# Patient Record
Sex: Female | Born: 1970
Health system: Southern US, Community
[De-identification: ages and names within clinical notes are randomized; demographics above are authoritative.]

## PROBLEM LIST (undated history)

## (undated) DIAGNOSIS — F419 Anxiety disorder, unspecified: Secondary | ICD-10-CM

## (undated) DIAGNOSIS — N951 Menopausal and female climacteric states: Secondary | ICD-10-CM

## (undated) DIAGNOSIS — R4589 Other symptoms and signs involving emotional state: Secondary | ICD-10-CM

## (undated) DIAGNOSIS — I10 Essential (primary) hypertension: Secondary | ICD-10-CM

## (undated) DIAGNOSIS — E119 Type 2 diabetes mellitus without complications: Secondary | ICD-10-CM

## (undated) DIAGNOSIS — N926 Irregular menstruation, unspecified: Secondary | ICD-10-CM

## (undated) HISTORY — DX: Anxiety disorder, unspecified: F41.9

## (undated) HISTORY — DX: Essential (primary) hypertension: I10

## (undated) HISTORY — DX: Menopausal and female climacteric states: N95.1

## (undated) HISTORY — DX: Irregular menstruation, unspecified: N92.6

## (undated) HISTORY — PX: CHOLECYSTECTOMY: SHX55

## (undated) HISTORY — PX: BUNIONECTOMY: SHX129

## (undated) HISTORY — DX: Other symptoms and signs involving emotional state: R45.89

## (undated) HISTORY — DX: Type 2 diabetes mellitus without complications: E11.9

---

## 2000-10-19 ENCOUNTER — Other Ambulatory Visit: Admission: RE | Admit: 2000-10-19 | Discharge: 2000-10-19 | Payer: Self-pay | Admitting: Obstetrics and Gynecology

## 2005-05-13 ENCOUNTER — Emergency Department (HOSPITAL_COMMUNITY): Admission: EM | Admit: 2005-05-13 | Discharge: 2005-05-14 | Payer: Self-pay | Admitting: Emergency Medicine

## 2005-09-04 ENCOUNTER — Emergency Department (HOSPITAL_COMMUNITY): Admission: EM | Admit: 2005-09-04 | Discharge: 2005-09-04 | Payer: Self-pay | Admitting: Emergency Medicine

## 2005-10-15 ENCOUNTER — Ambulatory Visit (HOSPITAL_COMMUNITY): Admission: RE | Admit: 2005-10-15 | Discharge: 2005-10-15 | Payer: Self-pay | Admitting: Gastroenterology

## 2005-10-22 ENCOUNTER — Ambulatory Visit (HOSPITAL_COMMUNITY): Admission: RE | Admit: 2005-10-22 | Discharge: 2005-10-22 | Payer: Self-pay | Admitting: Otolaryngology

## 2005-11-28 ENCOUNTER — Emergency Department (HOSPITAL_COMMUNITY): Admission: EM | Admit: 2005-11-28 | Discharge: 2005-11-28 | Payer: Self-pay | Admitting: Emergency Medicine

## 2006-01-10 ENCOUNTER — Ambulatory Visit: Payer: Self-pay | Admitting: Cardiology

## 2006-01-18 ENCOUNTER — Encounter: Payer: Self-pay | Admitting: Cardiology

## 2006-01-18 ENCOUNTER — Ambulatory Visit: Payer: Self-pay

## 2006-01-19 ENCOUNTER — Ambulatory Visit: Payer: Self-pay | Admitting: Cardiology

## 2006-02-23 ENCOUNTER — Ambulatory Visit: Payer: Self-pay

## 2006-03-29 ENCOUNTER — Ambulatory Visit (HOSPITAL_COMMUNITY): Admission: RE | Admit: 2006-03-29 | Discharge: 2006-03-29 | Payer: Self-pay | Admitting: Family Medicine

## 2006-04-22 ENCOUNTER — Ambulatory Visit (HOSPITAL_COMMUNITY): Admission: RE | Admit: 2006-04-22 | Discharge: 2006-04-22 | Payer: Self-pay | Admitting: Family Medicine

## 2006-04-29 ENCOUNTER — Encounter (HOSPITAL_COMMUNITY): Admission: RE | Admit: 2006-04-29 | Discharge: 2006-05-29 | Payer: Self-pay | Admitting: Family Medicine

## 2006-05-09 ENCOUNTER — Ambulatory Visit (HOSPITAL_COMMUNITY): Admission: RE | Admit: 2006-05-09 | Discharge: 2006-05-09 | Payer: Self-pay | Admitting: General Surgery

## 2006-05-09 ENCOUNTER — Encounter (INDEPENDENT_AMBULATORY_CARE_PROVIDER_SITE_OTHER): Payer: Self-pay | Admitting: *Deleted

## 2006-06-16 ENCOUNTER — Ambulatory Visit (HOSPITAL_COMMUNITY): Admission: RE | Admit: 2006-06-16 | Discharge: 2006-06-16 | Payer: Self-pay | Admitting: General Surgery

## 2007-03-21 ENCOUNTER — Other Ambulatory Visit: Admission: RE | Admit: 2007-03-21 | Discharge: 2007-03-21 | Payer: Self-pay | Admitting: Obstetrics and Gynecology

## 2007-08-10 ENCOUNTER — Ambulatory Visit (HOSPITAL_COMMUNITY): Admission: RE | Admit: 2007-08-10 | Discharge: 2007-08-10 | Payer: Self-pay | Admitting: Family Medicine

## 2007-12-15 ENCOUNTER — Other Ambulatory Visit: Admission: RE | Admit: 2007-12-15 | Discharge: 2007-12-15 | Payer: Self-pay | Admitting: Obstetrics and Gynecology

## 2007-12-19 ENCOUNTER — Ambulatory Visit (HOSPITAL_COMMUNITY): Admission: RE | Admit: 2007-12-19 | Discharge: 2007-12-19 | Payer: Self-pay | Admitting: Obstetrics & Gynecology

## 2008-01-05 ENCOUNTER — Encounter (HOSPITAL_COMMUNITY): Admission: RE | Admit: 2008-01-05 | Discharge: 2008-02-04 | Payer: Self-pay | Admitting: Endocrinology

## 2008-08-01 ENCOUNTER — Ambulatory Visit (HOSPITAL_COMMUNITY): Admission: RE | Admit: 2008-08-01 | Discharge: 2008-08-01 | Payer: Self-pay | Admitting: Obstetrics & Gynecology

## 2009-01-13 ENCOUNTER — Other Ambulatory Visit: Admission: RE | Admit: 2009-01-13 | Discharge: 2009-01-13 | Payer: Self-pay | Admitting: Obstetrics and Gynecology

## 2009-08-04 ENCOUNTER — Ambulatory Visit (HOSPITAL_COMMUNITY): Admission: RE | Admit: 2009-08-04 | Discharge: 2009-08-04 | Payer: Self-pay | Admitting: Obstetrics & Gynecology

## 2009-12-15 ENCOUNTER — Other Ambulatory Visit: Admission: RE | Admit: 2009-12-15 | Discharge: 2009-12-15 | Payer: Self-pay | Admitting: Obstetrics and Gynecology

## 2010-06-30 ENCOUNTER — Other Ambulatory Visit (HOSPITAL_COMMUNITY): Payer: Self-pay | Admitting: Family Medicine

## 2010-06-30 DIAGNOSIS — Z139 Encounter for screening, unspecified: Secondary | ICD-10-CM

## 2010-07-31 NOTE — Assessment & Plan Note (Signed)
[facility]                              CARDIOLOGY OFFICE NOTE   NAME:Lori Kline, Lori Kline                     MRN:          [id number]  DATE:01/10/2006                            DOB:          01-16-1971    The patient is a 40 year old female with no prior cardiac history I am asked  to evaluate for palpitations.  She occasionally has mild dyspnea on exertion  but there is no orthopnea, PND, pedal edema, presycope, syncope or  exertional chest pain.  Since June she has had occasional palpitations.  These predominately occur at night.  They are sudden in onset and described  as her heart skipping and racing.  There is no associated chest pain but  there is mild shortness of breath.  There is no syncope.  They can last for  2 hours at a time and resolve spontaneously.  She states that she falls  asleep and when she awakes they are gone.  She has had 1 to 2 episodes per  month and her last was in September.  Because of her palpitations we were  asked to further evaluate. Note she was placed on Toprol for this but she  did not tolerate the medication due to fatigue.   Her medications at present include Altace 5 mg p.o. daily.   SOCIAL HISTORY:  She does not smoke, nor does she consume alcohol.   FAMILY HISTORY:  Is negative for sudden death or coronary artery disease in  her immediate family.   PAST MEDICAL HISTORY:  Significant for hypertension but there is no diabetes  mellitus or hyperlipidemia.  She has had a prior EGD for reflux but has  otherwise had no other problems.  She does have occasional mild asthma.   REVIEW OF SYSTEMS:  She denies any headaches or fevers or chills.  There is  no productive cough or hemoptysis.  There is no dysphagia or odynophagia,  melena or hematochezia.  There is no dysuria, hematuria, frequency.  No rash  or seizure activity.  There is no orthopnea, PND or pedal edema.  The  remaining systems are negative.   PHYSICAL EXAMINATION:  Shows blood pressure of 118/76 and her pulse is 91.  She weighs 162 pounds.  She is well-developed and well-nourished.  No acute distress.  SKIN:  Is warm and dry.  She does not appear to be depressed and there is no peripheral clubbing.  HEENT:  Unremarkable with no mileage.  NECK:  Is supple with a normal upstroke bilaterally, cannot appreciate  bruits.  There is no jugular venous distention, no thyromegaly is noted.  CHEST:  Clear to auscultation, normal expansion.  CARDIOVASCULAR:  Shows a regular rate and rhythm with normal S1 and S2.  There are no murmurs, rubs, gallops noted.  ABDOMEN:  Not tender, positive bowel sounds, no hepatosplenomegaly, no  masses appreciated.  There is no abdominal bruit. She had 2+ femoral pulses  bilaterally, no bruits.  EXTREMITIES:  Showed no edema.  I cannot palpate no cords.  She has 2+  dorsalis pedis pulses bilaterally.  NEUROLOGIC:  Grossly  intact.   Her left cardiogram shows a sinus rhythm at a rate of 91.  The axis is  normal.  There are nonspecific anterior T-wave changes.   DIAGNOSES:  1. Palpitations.  2. Hypertension.   PLAN:  1. Lori Kline is complaining of palpitations.  We will proceed with a      CardioNet and we will fully evaluate.  If we indeed demonstrate      arrhythmia then we can pursue based on those results.  Of note she has      not tolerated Toprol due to fatigue but we could certainly try a      different beta blocker in the future to see if she would tolerate.  I      will also schedule her a TSH to exclude hyperthyroidism and also an      echocardiogram to quantify left ventricular function.  2. We will see her back in 6 weeks to review the above information.  Note      she only consumes 1 caffeinated beverage per day.      t    ______________________________  Madolyn Frieze. Jens Som, MD, [facility]    BSC/MedQ  DD: 01/10/2006  DT: 01/11/2006  Job #: [id number]

## 2010-07-31 NOTE — H&P (Signed)
NAME:  Lori Kline, Lori Kline NO.:  [id number]   MEDICAL RECORD NO.:  [id number]          PATIENT TYPE:  AMB   LOCATION:  DAY                           FACILITY:  [facility]   PHYSICIAN:  Dalia Heading, M.D.  DATE OF BIRTH:  [birth year]   DATE OF ADMISSION:  DATE OF DISCHARGE:  LH                              HISTORY & PHYSICAL   CHIEF COMPLAINT:  Chronic cholecystitis.   HISTORY OF PRESENT ILLNESS:  The patient is a 40 year old white female  who is referred for evaluation and treatment of biliary colic secondary  to chronic cholecystitis.  She has been having right upper quadrant  abdominal pain, nausea and bloating for many months.  She does have  fatty food intolerance.  No fever, chills or jaundice have been noted.   PAST MEDICAL HISTORY:  Includes a Chiari brain malformation.   PAST SURGICAL HISTORY:  Unremarkable.   CURRENT MEDICATIONS:  1. Altace 5 mg p.o. daily.  2. Prilosec p.r.n.   ALLERGIES:  No known drug allergies.   REVIEW OF SYSTEMS:  The patient denies drinking or smoking.   PHYSICAL EXAMINATION:  The patient is a well-developed, well-nourished  white female in no acute distress.  HEENT examination reveals no scleral  icterus.  Lungs clear to auscultation with equal breath sounds  bilaterally.  Heart examination reveals a regular rate and rhythm  without S3, S4 or murmurs.  The abdomen is soft and nondistended.  She  is tender in the right upper quadrant to palpation.  No  hepatosplenomegaly, masses, hernias are identified.  HIDA scan reveals  chronic cholecystitis with a low gallbladder ejection fraction.   IMPRESSION:  Chronic cholecystitis.   PLAN:  The patient is scheduled for laparoscopic cholecystectomy on  May 09, 2006.  The risks and benefits of the procedure including  bleeding, infection, hepatobiliary, the possibility of an open procedure  were fully explained to the patient, gave informed consent.      Dalia Heading, M.D.  Electronically Signed     MAJ/MEDQ  D:  05/05/2006  T:  05/05/2006  Job:  [id number]   cc:   [facility] Short-Stay   Patrica Duel, M.D.  Fax: [phone]

## 2010-07-31 NOTE — Op Note (Signed)
NAME:  Lori Kline, SHAWGO NO.:  [id number]   MEDICAL RECORD NO.:  [id number]          PATIENT TYPE:  AMB   LOCATION:  DAY                           FACILITY:  [facility]   PHYSICIAN:  Dalia Heading, M.D.  DATE OF BIRTH:  10-May-1970   DATE OF PROCEDURE:  05/09/2006  DATE OF DISCHARGE:                               OPERATIVE REPORT   PREOPERATIVE DIAGNOSIS:  Chronic cholecystitis.   POSTOPERATIVE DIAGNOSIS:  Chronic cholecystitis.   PROCEDURE:  Laparoscopic cholecystectomy.   SURGEON:  Dr. Franky Macho   ANESTHESIA:  General endotracheal.   INDICATIONS:  The patient is a 40 year old white female who is referred  for evaluation and treatment of biliary colic secondary to chronic  cholecystitis.  Risks and benefits of procedure including bleeding,  infection, hepatobiliary injury, possibly an open procedure were fully  explained to the patient, gave informed consent.   PROCEDURE NOTE:  The patient was placed in supine position.  After  induction of general endotracheal anesthesia, the abdomen was prepped  and draped in the usual sterile technique with Betadine.  Surgical site  confirmation was performed.   An infraumbilical incision was made down to fascia.  Veress needle was  introduced into the abdominal cavity and confirmation of placement was  done using the saline drop test.  The abdomen was then insufflated to 16  mmHg pressure.  11 mm trocar was introduced into the abdominal cavity  under direct visualization without difficulty.  The patient was placed  in reversed Trendelenburg position.  Additional 11-mm trocar placed in  epigastric region and 5-mm trocars were placed in the right upper  quadrant and right flank regions.  Liver was inspected and noted to be  within normal limits.  The gallbladder was retracted superior and  laterally.  The dissection was begun around the infundibulum of the  gallbladder.  The cystic duct was first identified.  Its junction  to the  infundibulum fully identified.  Endoclips placed proximally and distally  on cystic duct and cystic duct was divided.  This was likewise done to  cystic artery.  The gallbladder then freed away from the gallbladder  fossa using Bovie electrocautery.  The gallbladder delivered through the  epigastric trocar site using EndoCatch bag.  The gallbladder fossa was  inspected.  No abnormal bleeding or bile leakage was noted.  Surgicel  was placed in gallbladder fossa.  All fluid and air were then evacuated  from the abdominal cavity prior to removal of the trocars.   All wounds were irrigated with normal saline.  All wounds were injected  with 0.5% Sensorcaine.  The infraumbilical fascia was reapproximated  using an 0 Vicryl interrupted suture.  All skin incisions were closed  using staples.  Betadine ointment and dry sterile dressings were  applied.   All tape and needle counts were correct at end of the procedure.  The  patient was extubated in the operating room, went back to recovery room  awake in stable condition.  Complications none.   SPECIMEN:  Gallbladder.   BLOOD LOSS:  Minimal.      Loraine Leriche  Val Riles, M.D.  Electronically Signed     MAJ/MEDQ  D:  05/09/2006  T:  05/09/2006  Job:  [id number]   cc:   Patrica Duel, M.D.  Fax: [phone]

## 2010-07-31 NOTE — Assessment & Plan Note (Signed)
[facility]                            CARDIOLOGY OFFICE NOTE   NAME:Towe, PEGGIE HORNAK                     MRN:          [id number]  DATE:02/23/2006                            DOB:          1970/10/14    Lori Kline returns in followup today.  She is a pleasant 40 year old  female whom I recently evaluated for palpitations.  We scheduled her to  have an echocardiogram, which was performed on January 18, 2006.  She  was found to have normal LV function.  There was no significant valvular  abnormality noted.  We also checked a TSH, which was normal at 0.42.  Finally, she had a CardioNet monitor that showed sinus rhythm.  Note,  she did not have any palpitations while she had the CardioNet monitor.  Since I saw her previously, she has not had any further palpitations,  and there is no dyspnea on exertion, exertional chest pain, or syncope.  She does occasionally have indigestion associated with water brash.   MEDICATIONS:  Altace 5 mg p.o. daily.   PHYSICAL EXAM:  Blood pressure 120/74, pulse 82.  She weighs 164 pounds.  NECK:  Supple.  CHEST:  Clear.  CARDIOVASCULAR:  Regular rate and rhythm.  EXTREMITIES:  No edema.   DIAGNOSES:  1. Palpitations.  2. History of hypertension.   PLAN:  Lori Kline is doing well since I saw her previously.  She has  had no further symptoms of palpitations.  There was no rhythm  disturbance on a CardioNet monitor, but again, she did not have any  symptoms.  We will continue to follow her closely.  If she has recurrent  palpitations in the future that are sustained, then we could potentially  have her return to the office for an electrocardiogram or to the  emergency room, and I have instructed her in this matter.  She has not  tolerated Toprol in the past, but we could try beta blocker in the  future if her symptoms worsen.  However, she would like to just continue  to follow this for now.  I will see her back  in 6 months, and if she is  doing well at that time, then we will see her back on an as needed  basis.     Madolyn Frieze Jens Som, MD, [facility]  Electronically Signed    BSC/MedQ  DD: 02/23/2006  DT: 02/23/2006  Job #: [id number]   cc:   [facility]

## 2010-08-07 ENCOUNTER — Ambulatory Visit (HOSPITAL_COMMUNITY)
Admission: RE | Admit: 2010-08-07 | Discharge: 2010-08-07 | Disposition: A | Discharge status: Home / Self Care | Payer: 59 | Source: Ambulatory Visit | Attending: Family Medicine | Admitting: Family Medicine

## 2010-08-07 DIAGNOSIS — Z139 Encounter for screening, unspecified: Secondary | ICD-10-CM

## 2010-08-07 DIAGNOSIS — Z1231 Encounter for screening mammogram for malignant neoplasm of breast: Secondary | ICD-10-CM | POA: Insufficient documentation

## 2010-08-14 ENCOUNTER — Other Ambulatory Visit: Payer: Self-pay | Admitting: Family Medicine

## 2010-08-14 DIAGNOSIS — R928 Other abnormal and inconclusive findings on diagnostic imaging of breast: Secondary | ICD-10-CM

## 2010-08-19 ENCOUNTER — Ambulatory Visit (HOSPITAL_COMMUNITY)
Admission: RE | Admit: 2010-08-19 | Discharge: 2010-08-19 | Disposition: A | Discharge status: Home / Self Care | Payer: 59 | Source: Ambulatory Visit | Attending: Family Medicine | Admitting: Family Medicine

## 2010-08-19 DIAGNOSIS — R928 Other abnormal and inconclusive findings on diagnostic imaging of breast: Secondary | ICD-10-CM

## 2010-08-19 DIAGNOSIS — N6489 Other specified disorders of breast: Secondary | ICD-10-CM | POA: Insufficient documentation

## 2011-01-13 ENCOUNTER — Other Ambulatory Visit (HOSPITAL_COMMUNITY)
Admission: RE | Admit: 2011-01-13 | Discharge: 2011-01-13 | Disposition: A | Discharge status: Home / Self Care | Payer: 59 | Source: Ambulatory Visit | Attending: Obstetrics and Gynecology | Admitting: Obstetrics and Gynecology

## 2011-01-13 ENCOUNTER — Other Ambulatory Visit: Payer: Self-pay | Admitting: Adult Health

## 2011-01-13 DIAGNOSIS — Z01419 Encounter for gynecological examination (general) (routine) without abnormal findings: Secondary | ICD-10-CM | POA: Insufficient documentation

## 2011-07-20 ENCOUNTER — Other Ambulatory Visit (HOSPITAL_COMMUNITY): Payer: Self-pay | Admitting: Family Medicine

## 2011-07-20 DIAGNOSIS — Z139 Encounter for screening, unspecified: Secondary | ICD-10-CM

## 2011-08-23 ENCOUNTER — Ambulatory Visit (HOSPITAL_COMMUNITY): Payer: 59

## 2011-08-26 ENCOUNTER — Ambulatory Visit (HOSPITAL_COMMUNITY)
Admission: RE | Admit: 2011-08-26 | Discharge: 2011-08-26 | Disposition: A | Discharge status: Home / Self Care | Payer: 59 | Source: Ambulatory Visit | Attending: Family Medicine | Admitting: Family Medicine

## 2011-08-26 DIAGNOSIS — Z139 Encounter for screening, unspecified: Secondary | ICD-10-CM

## 2011-08-26 DIAGNOSIS — Z1231 Encounter for screening mammogram for malignant neoplasm of breast: Secondary | ICD-10-CM | POA: Insufficient documentation

## 2012-01-18 ENCOUNTER — Other Ambulatory Visit: Payer: Self-pay | Admitting: Adult Health

## 2012-01-18 ENCOUNTER — Other Ambulatory Visit (HOSPITAL_COMMUNITY)
Admission: RE | Admit: 2012-01-18 | Discharge: 2012-01-18 | Disposition: A | Discharge status: Home / Self Care | Payer: 59 | Source: Ambulatory Visit | Attending: Obstetrics and Gynecology | Admitting: Obstetrics and Gynecology

## 2012-01-18 DIAGNOSIS — Z01419 Encounter for gynecological examination (general) (routine) without abnormal findings: Secondary | ICD-10-CM | POA: Insufficient documentation

## 2012-01-18 DIAGNOSIS — Z1151 Encounter for screening for human papillomavirus (HPV): Secondary | ICD-10-CM | POA: Insufficient documentation

## 2012-07-18 ENCOUNTER — Other Ambulatory Visit: Payer: Self-pay | Admitting: Adult Health

## 2012-07-18 DIAGNOSIS — Z139 Encounter for screening, unspecified: Secondary | ICD-10-CM

## 2012-08-28 ENCOUNTER — Ambulatory Visit (HOSPITAL_COMMUNITY)
Admission: RE | Admit: 2012-08-28 | Discharge: 2012-08-28 | Disposition: A | Discharge status: Home / Self Care | Payer: 59 | Source: Ambulatory Visit | Attending: Adult Health | Admitting: Adult Health

## 2012-08-28 DIAGNOSIS — Z139 Encounter for screening, unspecified: Secondary | ICD-10-CM

## 2012-08-28 DIAGNOSIS — Z1231 Encounter for screening mammogram for malignant neoplasm of breast: Secondary | ICD-10-CM | POA: Insufficient documentation

## 2013-02-12 ENCOUNTER — Other Ambulatory Visit: Payer: Self-pay | Admitting: Adult Health

## 2013-03-14 ENCOUNTER — Encounter: Payer: Self-pay | Admitting: Adult Health

## 2013-03-14 ENCOUNTER — Ambulatory Visit (INDEPENDENT_AMBULATORY_CARE_PROVIDER_SITE_OTHER): Payer: 59 | Admitting: Adult Health

## 2013-03-14 ENCOUNTER — Encounter (INDEPENDENT_AMBULATORY_CARE_PROVIDER_SITE_OTHER): Payer: Self-pay

## 2013-03-14 VITALS — BP 118/70 | HR 76 | Ht 61.5 in | Wt 170.0 lb

## 2013-03-14 DIAGNOSIS — Z01419 Encounter for gynecological examination (general) (routine) without abnormal findings: Secondary | ICD-10-CM

## 2013-03-14 DIAGNOSIS — Z1212 Encounter for screening for malignant neoplasm of rectum: Secondary | ICD-10-CM

## 2013-03-14 LAB — HEMOCCULT GUIAC POC 1CARD (OFFICE)

## 2013-03-14 NOTE — Patient Instructions (Signed)
Physical in 1 year Mammogram yearly Labs with PCP 

## 2013-03-14 NOTE — Progress Notes (Signed)
Patient ID: Lori Kline, female   DOB: [birth year], 42 y.o.   MRN: [id number] History of Present Illness: Lori Kline is a 42 year old white female, married in for a physical, she had a normal pap with negative HPV in 2013.No complaints,  Current Medications, Allergies, Past Medical History, Past Surgical History, Family History and Social History were reviewed in [employer] record.   Past Medical History  Diagnosis Date  . Hypertension   . Diabetes mellitus without complication   . Anxiety    Past Surgical History  Procedure Laterality Date  . Bunionectomy    . Cholecystectomy    Current outpatient prescriptions:escitalopram (LEXAPRO) 10 MG tablet, Take 10 mg by mouth daily. , Disp: , Rfl: ;  lisinopril (PRINIVIL,ZESTRIL) 5 MG tablet, Take 5 mg by mouth daily. , Disp: , Rfl: ;  metFORMIN (GLUCOPHAGE) 500 MG tablet, Take 250 mg by mouth 2 (two) times daily with a meal. , Disp: , Rfl: ;  ReliOn Ultra Thin Lancets MISC, , Disp: , Rfl:   Review of Systems: Patient denies any headaches, blurred vision, shortness of breath, chest pain, abdominal pain, problems with bowel movements, urination, or intercourse. No joint pain but is healing from bunionectomy, no mood swings,occasional hot flash.Periods regular.    Physical Exam:BP 118/70  Pulse 76  Ht 5' 1.5" (1.562 m)  Wt 170 lb (77.111 kg)  BMI 31.60 kg/m2  LMP 03/07/2013 General:  Well developed, well nourished, no acute distress Skin:  Warm and dry Neck:  Midline trachea, normal thyroid Lungs; Clear to auscultation bilaterally Breast:  No dominant palpable mass, retraction, or nipple discharge Cardiovascular: Regular rate and rhythm Abdomen:  Soft, non tender, no hepatosplenomegaly Pelvic:  External genitalia is normal in appearance.  The vagina is normal in appearance. The cervix is bulbous.  Uterus is felt to be normal size, shape, and contour.  No  adnexal masses or tenderness noted. Rectal: Good sphincter  tone, no polyps, or hemorrhoids felt.  Hemoccult negative. Extremities:  No swelling or varicosities noted Psych:  No mood changes,alert and cooperative,seems happy   Impression: Yearly gyn exam no pap   Plan: Physical in 1 year Mammogram yearly Labs with PCP(Dr Phillips Odor) Follow up any problems

## 2013-08-01 ENCOUNTER — Other Ambulatory Visit (HOSPITAL_COMMUNITY): Payer: Self-pay | Admitting: Family Medicine

## 2013-08-01 DIAGNOSIS — Z139 Encounter for screening, unspecified: Secondary | ICD-10-CM

## 2013-09-03 ENCOUNTER — Ambulatory Visit (HOSPITAL_COMMUNITY)
Admission: RE | Admit: 2013-09-03 | Discharge: 2013-09-03 | Disposition: A | Discharge status: Home / Self Care | Payer: 59 | Source: Ambulatory Visit | Attending: Family Medicine | Admitting: Family Medicine

## 2013-09-03 DIAGNOSIS — Z1231 Encounter for screening mammogram for malignant neoplasm of breast: Secondary | ICD-10-CM | POA: Insufficient documentation

## 2013-09-03 DIAGNOSIS — Z139 Encounter for screening, unspecified: Secondary | ICD-10-CM

## 2014-01-14 ENCOUNTER — Encounter: Payer: Self-pay | Admitting: Adult Health

## 2014-03-19 ENCOUNTER — Encounter: Payer: Self-pay | Admitting: Adult Health

## 2014-03-19 ENCOUNTER — Ambulatory Visit (INDEPENDENT_AMBULATORY_CARE_PROVIDER_SITE_OTHER): Payer: 59 | Admitting: Adult Health

## 2014-03-19 VITALS — BP 130/80 | HR 76 | Ht 60.5 in | Wt 169.5 lb

## 2014-03-19 DIAGNOSIS — Z01419 Encounter for gynecological examination (general) (routine) without abnormal findings: Secondary | ICD-10-CM

## 2014-03-19 DIAGNOSIS — Z1212 Encounter for screening for malignant neoplasm of rectum: Secondary | ICD-10-CM

## 2014-03-19 LAB — HEMOCCULT GUIAC POC 1CARD (OFFICE): Fecal Occult Blood, POC: NEGATIVE

## 2014-03-19 NOTE — Progress Notes (Signed)
Patient ID: Lori Kline, female   DOB: 1971/01/19, 44 y.o.   MRN: [id number] History of Present Illness: Lori Kline is a 44 year old white female, married in for gyn exam.She had a normal pap with negative HPV 01/18/12.   Current Medications, Allergies, Past Medical History, Past Surgical History, Family History and Social History were reviewed in [employer] record.     Review of Systems: Patient denies any daily headaches(does have before period at times), blurred vision, shortness of breath, chest pain, abdominal pain, problems with bowel movements, urination, or intercourse.  No joint pain or mood swings,last A1c 5.4 with PCP.Periods regular still, no hot flashes.   Physical Exam:BP 130/80 mmHg  Pulse 76  Ht 5' 0.5" (1.537 m)  Wt 169 lb 8 oz (76.885 kg)  BMI 32.55 kg/m2  LMP 02/18/2014 General:  Well developed, well nourished, no acute distress Skin:  Warm and dry Neck:  Midline trachea, normal thyroid Lungs; Clear to auscultation bilaterally Breast:  No dominant palpable mass, retraction, or nipple discharge Cardiovascular: Regular rate and rhythm Abdomen:  Soft, non tender, no hepatosplenomegaly Pelvic:  External genitalia is normal in appearance.  The vagina is normal in appearance. The cervix is bulbous.  Uterus is felt to be normal size, shape, and contour.  No   adnexal masses or tenderness noted. Rectal: Good sphincter tone, no polyps, or hemorrhoids felt.  Hemoccult negative. Extremities:  No swelling or varicosities noted,does have some spider veins Psych:  No mood changes,alert and cooperative,seems happy   Impression: Well woman gyn exam no pap   Plan: Pap and physical in 1 year Mammogram yearly Colonoscopy at 50  Labs with PCP

## 2014-03-19 NOTE — Patient Instructions (Signed)
Pap and physical in 1 year Mammogram yearly Labs with PCP 

## 2014-08-07 ENCOUNTER — Other Ambulatory Visit (HOSPITAL_COMMUNITY): Payer: Self-pay | Admitting: Family Medicine

## 2014-08-07 DIAGNOSIS — Z1231 Encounter for screening mammogram for malignant neoplasm of breast: Secondary | ICD-10-CM

## 2014-09-18 ENCOUNTER — Ambulatory Visit (HOSPITAL_COMMUNITY)
Admission: RE | Admit: 2014-09-18 | Discharge: 2014-09-18 | Disposition: A | Discharge status: Home / Self Care | Payer: 59 | Source: Ambulatory Visit | Attending: Family Medicine | Admitting: Family Medicine

## 2014-09-18 DIAGNOSIS — Z1231 Encounter for screening mammogram for malignant neoplasm of breast: Secondary | ICD-10-CM | POA: Diagnosis present

## 2015-01-21 HISTORY — PX: HAMMER TOE SURGERY: SHX385

## 2015-03-26 ENCOUNTER — Encounter: Payer: Self-pay | Admitting: Adult Health

## 2015-03-26 ENCOUNTER — Other Ambulatory Visit (HOSPITAL_COMMUNITY)
Admission: RE | Admit: 2015-03-26 | Discharge: 2015-03-26 | Disposition: A | Discharge status: Home / Self Care | Payer: 59 | Source: Ambulatory Visit | Attending: Adult Health | Admitting: Adult Health

## 2015-03-26 ENCOUNTER — Ambulatory Visit (INDEPENDENT_AMBULATORY_CARE_PROVIDER_SITE_OTHER): Payer: 59 | Admitting: Adult Health

## 2015-03-26 VITALS — BP 130/80 | HR 76 | Ht 61.0 in | Wt 168.5 lb

## 2015-03-26 DIAGNOSIS — Z01419 Encounter for gynecological examination (general) (routine) without abnormal findings: Secondary | ICD-10-CM

## 2015-03-26 DIAGNOSIS — R4589 Other symptoms and signs involving emotional state: Secondary | ICD-10-CM

## 2015-03-26 DIAGNOSIS — Z1151 Encounter for screening for human papillomavirus (HPV): Secondary | ICD-10-CM | POA: Insufficient documentation

## 2015-03-26 DIAGNOSIS — N951 Menopausal and female climacteric states: Secondary | ICD-10-CM

## 2015-03-26 DIAGNOSIS — N926 Irregular menstruation, unspecified: Secondary | ICD-10-CM | POA: Insufficient documentation

## 2015-03-26 DIAGNOSIS — Z1212 Encounter for screening for malignant neoplasm of rectum: Secondary | ICD-10-CM

## 2015-03-26 HISTORY — DX: Other symptoms and signs involving emotional state: R45.89

## 2015-03-26 HISTORY — DX: Irregular menstruation, unspecified: N92.6

## 2015-03-26 HISTORY — DX: Menopausal and female climacteric states: N95.1

## 2015-03-26 LAB — HEMOCCULT GUIAC POC 1CARD (OFFICE): Fecal Occult Blood, POC: NEGATIVE

## 2015-03-26 NOTE — Progress Notes (Signed)
Patient ID: Lori Kline, female   DOB: [birth year], 45 y.o.   MRN: [id number] History of Present Illness: Lori Kline is a 45 year old white female, married in for a well woman gyn exam and pap, she has had changes in cycle, seem shorter at times and sees some clots, is moody at times.Has had rare hot flash.Got flu shot in October at work. PCP is Dr Hilma Favors.    Current Medications, Allergies, Past Medical History, Past Surgical History, Family History and Social History were reviewed in [employer] record.     Review of Systems: Patient denies any headaches, hearing loss, fatigue, blurred vision, shortness of breath, chest pain, abdominal pain, problems with bowel movements, urination, or intercourse. No joint pain. See HPI for positives.    Physical Exam:BP 130/80 mmHg  Pulse 76  Ht 5\' 1"  (1.549 m)  Wt 168 lb 8 oz (76.431 kg)  BMI 31.85 kg/m2  LMP 03/09/2015 General:  Well developed, well nourished, no acute distress Skin:  Warm and dry Neck:  Midline trachea, normal thyroid, good ROM, no lymphadenopathy Lungs; Clear to auscultation bilaterally Breast:  No dominant palpable mass, retraction, or nipple discharge Cardiovascular: Regular rate and rhythm Abdomen:  Soft, non tender, no hepatosplenomegaly Pelvic:  External genitalia is normal in appearance, no lesions.  The vagina is normal in appearance. Urethra has no lesions or masses. The cervix is bulbous. Pap with HPV performed. Uterus is felt to be normal size, shape, and contour.  No adnexal masses or tenderness noted.Bladder is non tender, no masses felt. Rectal: Good sphincter tone, no polyps, or hemorrhoids felt.  Hemoccult negative. Extremities/musculoskeletal:  No swelling or varicosities noted, no clubbing or cyanosis Psych:  No mood changes, alert and cooperative,seems happy Discussed changes associated with peri menopause.  Impression: Well woman gyn exam and pap Peri menopause Irregular  cycles Moody     Plan: Physical in 1 year, pap in 3 if normal Mammogram yearly Labs with PCP Review handout on peri menopause

## 2015-03-26 NOTE — Patient Instructions (Addendum)
Mammogram yearly Labs with PCP Physical in 1 year, pap in 3 if normal Perimenopause Perimenopause is the time when your body begins to move into the menopause (no menstrual period for 12 straight months). It is a natural process. Perimenopause can begin 2-8 years before the menopause and usually lasts for 1 year after the menopause. During this time, your ovaries may or may not produce an egg. The ovaries vary in their production of estrogen and progesterone hormones each month. This can cause irregular menstrual periods, difficulty getting pregnant, vaginal bleeding between periods, and uncomfortable symptoms. CAUSES  Irregular production of the ovarian hormones, estrogen and progesterone, and not ovulating every month.  Other causes include:  Tumor of the pituitary gland in the brain.  Medical disease that affects the ovaries.  Radiation treatment.  Chemotherapy.  Unknown causes.  Heavy smoking and excessive alcohol intake can bring on perimenopause sooner. SIGNS AND SYMPTOMS   Hot flashes.  Night sweats.  Irregular menstrual periods.  Decreased sex drive.  Vaginal dryness.  Headaches.  Mood swings.  Depression.  Memory problems.  Irritability.  Tiredness.  Weight gain.  Trouble getting pregnant.  The beginning of losing bone cells (osteoporosis).  The beginning of hardening of the arteries (atherosclerosis). DIAGNOSIS  Your health care provider will make a diagnosis by analyzing your age, menstrual history, and symptoms. He or she will do a physical exam and note any changes in your body, especially your female organs. Female hormone tests may or may not be helpful depending on the amount of female hormones you produce and when you produce them. However, other hormone tests may be helpful to rule out other problems. TREATMENT  In some cases, no treatment is needed. The decision on whether treatment is necessary during the perimenopause should be made by you  and your health care provider based on how the symptoms are affecting you and your lifestyle. Various treatments are available, such as:  Treating individual symptoms with a specific medicine for that symptom.  Herbal medicines that can help specific symptoms.  Counseling.  Group therapy. HOME CARE INSTRUCTIONS   Keep track of your menstrual periods (when they occur, how heavy they are, how long between periods, and how long they last) as well as your symptoms and when they started.  Only take over-the-counter or prescription medicines as directed by your health care provider.  Sleep and rest.  Exercise.  Eat a diet that contains calcium (good for your bones) and soy (acts like the estrogen hormone).  Do not smoke.  Avoid alcoholic beverages.  Take vitamin supplements as recommended by your health care provider. Taking vitamin E may help in certain cases.  Take calcium and vitamin D supplements to help prevent bone loss.  Group therapy is sometimes helpful.  Acupuncture may help in some cases. SEEK MEDICAL CARE IF:   You have questions about any symptoms you are having.  You need a referral to a specialist (gynecologist, psychiatrist, or psychologist). SEEK IMMEDIATE MEDICAL CARE IF:   You have vaginal bleeding.  Your period lasts longer than 8 days.  Your periods are recurring sooner than 21 days.  You have bleeding after intercourse.  You have severe depression.  You have pain when you urinate.  You have severe headaches.  You have vision problems.   This information is not intended to replace advice given to you by your health care provider. Make sure you discuss any questions you have with your health care provider.   Document Released:  04/08/2004 Document Revised: 03/22/2014 Document Reviewed: 09/28/2012 Elsevier Interactive Patient Education [employer].

## 2015-03-28 LAB — CYTOLOGY - PAP

## 2015-07-03 ENCOUNTER — Other Ambulatory Visit: Payer: Self-pay

## 2015-08-14 ENCOUNTER — Other Ambulatory Visit (HOSPITAL_COMMUNITY): Payer: Self-pay | Admitting: Family Medicine

## 2015-08-14 DIAGNOSIS — Z1231 Encounter for screening mammogram for malignant neoplasm of breast: Secondary | ICD-10-CM

## 2015-09-22 ENCOUNTER — Ambulatory Visit (HOSPITAL_COMMUNITY)
Admission: RE | Admit: 2015-09-22 | Discharge: 2015-09-22 | Disposition: A | Discharge status: Home / Self Care | Payer: 59 | Source: Ambulatory Visit | Attending: Family Medicine | Admitting: Family Medicine

## 2015-09-22 DIAGNOSIS — Z1231 Encounter for screening mammogram for malignant neoplasm of breast: Secondary | ICD-10-CM | POA: Diagnosis present

## 2015-10-10 ENCOUNTER — Telehealth: Payer: Self-pay | Admitting: *Deleted

## 2015-10-10 NOTE — Telephone Encounter (Signed)
Left message to call me back but sounds like menopause

## 2016-03-29 ENCOUNTER — Encounter: Payer: Self-pay | Admitting: Adult Health

## 2016-03-29 ENCOUNTER — Ambulatory Visit (INDEPENDENT_AMBULATORY_CARE_PROVIDER_SITE_OTHER): Payer: 59 | Admitting: Adult Health

## 2016-03-29 VITALS — BP 124/63 | HR 74 | Ht 60.75 in | Wt 164.5 lb

## 2016-03-29 DIAGNOSIS — N926 Irregular menstruation, unspecified: Secondary | ICD-10-CM

## 2016-03-29 DIAGNOSIS — Z01419 Encounter for gynecological examination (general) (routine) without abnormal findings: Secondary | ICD-10-CM

## 2016-03-29 DIAGNOSIS — Z1212 Encounter for screening for malignant neoplasm of rectum: Secondary | ICD-10-CM | POA: Diagnosis not present

## 2016-03-29 DIAGNOSIS — Z01411 Encounter for gynecological examination (general) (routine) with abnormal findings: Secondary | ICD-10-CM | POA: Diagnosis not present

## 2016-03-29 DIAGNOSIS — F489 Nonpsychotic mental disorder, unspecified: Secondary | ICD-10-CM

## 2016-03-29 DIAGNOSIS — Z1211 Encounter for screening for malignant neoplasm of colon: Secondary | ICD-10-CM

## 2016-03-29 DIAGNOSIS — R4589 Other symptoms and signs involving emotional state: Secondary | ICD-10-CM

## 2016-03-29 DIAGNOSIS — N951 Menopausal and female climacteric states: Secondary | ICD-10-CM

## 2016-03-29 LAB — HEMOCCULT GUIAC POC 1CARD (OFFICE): Fecal Occult Blood, POC: NEGATIVE

## 2016-03-29 NOTE — Progress Notes (Signed)
Patient ID: Lori Kline, female   DOB: 1970/05/12, 46 y.o.   MRN: [id number] History of Present Illness: Lori Kline is a 46 year old white female,married in for well woman gyn exam, she had normal pap and negative HPV 1/11/7. She is having some irregular periods, hot flashes, moody and headaches before periods at times. PCP is Dr Hilma Favors.    Current Medications, Allergies, Past Medical History, Past Surgical History, Family History and Social History were reviewed in [employer] record.     Review of Systems: Patient denies any hearing loss, fatigue, blurred vision, shortness of breath, chest pain, abdominal pain, problems with bowel movements, urination, or intercourse. No joint pain or mood swings.See HPI for positives.    Physical Exam:BP 124/63 (BP Location: Left Arm, Patient Position: Sitting, Cuff Size: Normal)   Pulse 74   Ht 5' 0.75" (1.543 m)   Wt 164 lb 8 oz (74.6 kg)   LMP 03/22/2016 (Exact Date)   BMI 31.34 kg/m  General:  Well developed, well nourished, no acute distress Skin:  Warm and dry Neck:  Midline trachea, normal thyroid, good ROM, no lymphadenopathy Lungs; Clear to auscultation bilaterally Breast:  No dominant palpable mass, retraction, or nipple discharge Cardiovascular: Regular rate and rhythm Abdomen:  Soft, non tender, no hepatosplenomegaly Pelvic:  External genitalia is normal in appearance, no lesions.  The vagina is normal in appearance. Urethra has no lesions or masses. The cervix is smooth.  Uterus is felt to be normal size, shape, and contour.  No adnexal masses or tenderness noted.Bladder is non tender, no masses felt. Rectal: Good sphincter tone, no polyps, or hemorrhoids felt.  Hemoccult negative. Extremities/musculoskeletal:  No swelling or varicosities noted, no clubbing or cyanosis Psych:  No mood changes, alert and cooperative,seems happy PHQ 2 score 0. Discussed menopause symptoms, and could try HRT, declines for now.    Impression:  1. Well woman exam with routine gynecological exam   2. Peri-menopause   3. Irregular periods/menstrual cycles   4. Moody   5. Hot flashes due to menopause   6. Screening for colorectal cancer      Plan: Physical in 1 year, pap in 2020 Mammogram yearly Colonoscopy at 46 Labs with PCP  Review handout on menopause

## 2016-03-29 NOTE — Patient Instructions (Signed)
Menopause Menopause is the normal time of life when menstrual periods stop completely. Menopause is complete when you have missed 12 consecutive menstrual periods. It usually occurs between the ages of 48 years and 55 years. Very rarely does a woman develop menopause before the age of 40 years. At menopause, your ovaries stop producing the female hormones estrogen and progesterone. This can cause undesirable symptoms and also affect your health. Sometimes the symptoms may occur 4-5 years before the menopause begins. There is no relationship between menopause and:  Oral contraceptives.  Number of children you had.  Race.  The age your menstrual periods started (menarche).  Heavy smokers and very thin women may develop menopause earlier in life. What are the causes?  The ovaries stop producing the female hormones estrogen and progesterone. Other causes include:  Surgery to remove both ovaries.  The ovaries stop functioning for no known reason.  Tumors of the pituitary gland in the brain.  Medical disease that affects the ovaries and hormone production.  Radiation treatment to the abdomen or pelvis.  Chemotherapy that affects the ovaries.  What are the signs or symptoms?  Hot flashes.  Night sweats.  Decrease in sex drive.  Vaginal dryness and thinning of the vagina causing painful intercourse.  Dryness of the skin and developing wrinkles.  Headaches.  Tiredness.  Irritability.  Memory problems.  Weight gain.  Bladder infections.  Hair growth of the face and chest.  Infertility. More serious symptoms include:  Loss of bone (osteoporosis) causing breaks (fractures).  Depression.  Hardening and narrowing of the arteries (atherosclerosis) causing heart attacks and strokes.  How is this diagnosed?  When the menstrual periods have stopped for 12 straight months.  Physical exam.  Hormone studies of the blood. How is this treated? There are many treatment  choices and nearly as many questions about them. The decisions to treat or not to treat menopausal changes is an individual choice made with your health care provider. Your health care provider can discuss the treatments with you. Together, you can decide which treatment will work best for you. Your treatment choices may include:  Hormone therapy (estrogen and progesterone).  Non-hormonal medicines.  Treating the individual symptoms with medicine (for example antidepressants for depression).  Herbal medicines that may help specific symptoms.  Counseling by a psychiatrist or psychologist.  Group therapy.  Lifestyle changes including: ? Eating healthy. ? Regular exercise. ? Limiting caffeine and alcohol. ? Stress management and meditation.  No treatment.  Follow these instructions at home:  Take the medicine your health care provider gives you as directed.  Get plenty of sleep and rest.  Exercise regularly.  Eat a diet that contains calcium (good for the bones) and soy products (acts like estrogen hormone).  Avoid alcoholic beverages.  Do not smoke.  If you have hot flashes, dress in layers.  Take supplements, calcium, and vitamin D to strengthen bones.  You can use over-the-counter lubricants or moisturizers for vaginal dryness.  Group therapy is sometimes very helpful.  Acupuncture may be helpful in some cases. Contact a health care provider if:  You are not sure you are in menopause.  You are having menopausal symptoms and need advice and treatment.  You are still having menstrual periods after age 55 years.  You have pain with intercourse.  Menopause is complete (no menstrual period for 12 months) and you develop vaginal bleeding.  You need a referral to a specialist (gynecologist, psychiatrist, or psychologist) for treatment. Get help right   away if:  You have severe depression.  You have excessive vaginal bleeding.  You fell and think you have a  broken bone.  You have pain when you urinate.  You develop leg or chest pain.  You have a fast pounding heart beat (palpitations).  You have severe headaches.  You develop vision problems.  You feel a lump in your breast.  You have abdominal pain or severe indigestion. This information is not intended to replace advice given to you by your health care provider. Make sure you discuss any questions you have with your health care provider. Document Released: 05/22/2003 Document Revised: 08/07/2015 Document Reviewed: 09/28/2012 Elsevier Interactive Patient Education  2017 [employer]  

## 2016-04-21 DIAGNOSIS — E119 Type 2 diabetes mellitus without complications: Secondary | ICD-10-CM | POA: Diagnosis not present

## 2016-04-21 DIAGNOSIS — Z1389 Encounter for screening for other disorder: Secondary | ICD-10-CM | POA: Diagnosis not present

## 2016-07-06 DIAGNOSIS — M654 Radial styloid tenosynovitis [de Quervain]: Secondary | ICD-10-CM | POA: Diagnosis not present

## 2016-07-23 DIAGNOSIS — M654 Radial styloid tenosynovitis [de Quervain]: Secondary | ICD-10-CM | POA: Diagnosis not present

## 2016-08-18 ENCOUNTER — Other Ambulatory Visit: Payer: Self-pay | Admitting: Adult Health

## 2016-08-18 DIAGNOSIS — Z1231 Encounter for screening mammogram for malignant neoplasm of breast: Secondary | ICD-10-CM

## 2016-09-01 DIAGNOSIS — E11319 Type 2 diabetes mellitus with unspecified diabetic retinopathy without macular edema: Secondary | ICD-10-CM | POA: Diagnosis not present

## 2016-09-23 ENCOUNTER — Ambulatory Visit (HOSPITAL_COMMUNITY)
Admission: RE | Admit: 2016-09-23 | Discharge: 2016-09-23 | Disposition: A | Discharge status: Home / Self Care | Payer: 59 | Source: Ambulatory Visit | Attending: Adult Health | Admitting: Adult Health

## 2016-09-23 DIAGNOSIS — Z1231 Encounter for screening mammogram for malignant neoplasm of breast: Secondary | ICD-10-CM | POA: Diagnosis present

## 2016-09-27 DIAGNOSIS — D229 Melanocytic nevi, unspecified: Secondary | ICD-10-CM | POA: Diagnosis not present

## 2016-09-27 DIAGNOSIS — L821 Other seborrheic keratosis: Secondary | ICD-10-CM | POA: Diagnosis not present

## 2016-09-27 DIAGNOSIS — L814 Other melanin hyperpigmentation: Secondary | ICD-10-CM | POA: Diagnosis not present

## 2016-11-26 DIAGNOSIS — E119 Type 2 diabetes mellitus without complications: Secondary | ICD-10-CM | POA: Diagnosis not present

## 2017-05-02 DIAGNOSIS — E119 Type 2 diabetes mellitus without complications: Secondary | ICD-10-CM | POA: Diagnosis not present

## 2017-05-18 ENCOUNTER — Encounter: Payer: Self-pay | Admitting: Adult Health

## 2017-05-18 ENCOUNTER — Ambulatory Visit (INDEPENDENT_AMBULATORY_CARE_PROVIDER_SITE_OTHER): Payer: 59 | Admitting: Adult Health

## 2017-05-18 VITALS — BP 130/80 | HR 82 | Ht 60.5 in | Wt 167.0 lb

## 2017-05-18 DIAGNOSIS — Z01411 Encounter for gynecological examination (general) (routine) with abnormal findings: Secondary | ICD-10-CM

## 2017-05-18 DIAGNOSIS — Z1211 Encounter for screening for malignant neoplasm of colon: Secondary | ICD-10-CM

## 2017-05-18 DIAGNOSIS — N3946 Mixed incontinence: Secondary | ICD-10-CM | POA: Insufficient documentation

## 2017-05-18 DIAGNOSIS — N951 Menopausal and female climacteric states: Secondary | ICD-10-CM | POA: Diagnosis not present

## 2017-05-18 DIAGNOSIS — Z01419 Encounter for gynecological examination (general) (routine) without abnormal findings: Secondary | ICD-10-CM | POA: Insufficient documentation

## 2017-05-18 DIAGNOSIS — N926 Irregular menstruation, unspecified: Secondary | ICD-10-CM

## 2017-05-18 DIAGNOSIS — Z1212 Encounter for screening for malignant neoplasm of rectum: Secondary | ICD-10-CM | POA: Diagnosis not present

## 2017-05-18 LAB — HEMOCCULT GUIAC POC 1CARD (OFFICE): Fecal Occult Blood, POC: NEGATIVE

## 2017-05-18 NOTE — Progress Notes (Signed)
Patient ID: Lori Kline, female   DOB: [birth year], 47 y.o.   MRN: [id number] History of Present Illness:  Lori Kline is a 47 year old white female, married,G2P2 in for a well woman gyn exam, she had normal pap with negative  HPV 03/26/15.Last A1c 6.1. PCP is Dr Hilma Favors.  Current Medications, Allergies, Past Medical History, Past Surgical History, Family History and Social History were reviewed in [employer] record.     Review of Systems:  Patient denies any headaches, hearing loss, fatigue, blurred vision, shortness of breath, chest pain, abdominal pain, problems with bowel movements, or intercourse. No joint pain or mood swings. +UI, if has to go, or exercises or coughs, and +irregular periods  Physical Exam:BP 130/80 (BP Location: Left Arm, Patient Position: Sitting, Cuff Size: Normal)   Pulse 82   Ht 5' 0.5" (1.537 m)   Wt 167 lb (75.8 kg)   LMP 03/09/2017   BMI 32.08 kg/m  General:  Well developed, well nourished, no acute distress Skin:  Warm and dry Neck:  Midline trachea, normal thyroid, good ROM, no lymphadenopathy Lungs; Clear to auscultation bilaterally Breast:  No dominant palpable mass, retraction, or nipple discharge Cardiovascular: Regular rate and rhythm Abdomen:  Soft, non tender, no hepatosplenomegaly Pelvic:  External genitalia is normal in appearance, no lesions.  The vagina is normal in appearance. Urethra has no lesions or masses. The cervix is bulbous.  Uterus is felt to be normal size, shape, and contour.  No adnexal masses or tenderness noted.Bladder is non tender, no masses felt. Rectal: Good sphincter tone, no polyps, or hemorrhoids felt.  Hemoccult negative. Extremities/musculoskeletal:  No swelling or varicosities noted, no clubbing or cyanosis Psych:  No mood changes, alert and cooperative,seems happy PHQ 2 score 0.  Impression: 1. Well woman exam with routine gynecological exam   2. Screening for colorectal cancer   3.  Irregular periods/menstrual cycles   4. Peri-menopause   5. Mixed stress and urge urinary incontinence       Plan: Pap and physical in 1 year Mammogram yearly Labs with PCP Can refer to urology if desired.

## 2017-08-02 ENCOUNTER — Telehealth: Payer: Self-pay | Admitting: Adult Health

## 2017-08-02 DIAGNOSIS — N3946 Mixed incontinence: Secondary | ICD-10-CM

## 2017-08-02 NOTE — Telephone Encounter (Signed)
Pt has mixed UI,wants referral to urology,[facility]

## 2017-08-24 ENCOUNTER — Other Ambulatory Visit: Payer: Self-pay | Admitting: Adult Health

## 2017-08-24 DIAGNOSIS — Z1231 Encounter for screening mammogram for malignant neoplasm of breast: Secondary | ICD-10-CM

## 2017-09-13 ENCOUNTER — Ambulatory Visit (INDEPENDENT_AMBULATORY_CARE_PROVIDER_SITE_OTHER): Payer: 59 | Admitting: Urology

## 2017-09-13 DIAGNOSIS — N3941 Urge incontinence: Secondary | ICD-10-CM

## 2017-09-13 DIAGNOSIS — N393 Stress incontinence (female) (male): Secondary | ICD-10-CM | POA: Diagnosis not present

## 2017-09-28 ENCOUNTER — Ambulatory Visit (HOSPITAL_COMMUNITY)
Admission: RE | Admit: 2017-09-28 | Discharge: 2017-09-28 | Disposition: A | Discharge status: Home / Self Care | Payer: 59 | Source: Ambulatory Visit | Attending: Adult Health | Admitting: Adult Health

## 2017-09-28 DIAGNOSIS — Z1231 Encounter for screening mammogram for malignant neoplasm of breast: Secondary | ICD-10-CM | POA: Insufficient documentation

## 2017-10-05 DIAGNOSIS — E11319 Type 2 diabetes mellitus with unspecified diabetic retinopathy without macular edema: Secondary | ICD-10-CM | POA: Diagnosis not present

## 2017-12-14 DIAGNOSIS — R591 Generalized enlarged lymph nodes: Secondary | ICD-10-CM | POA: Diagnosis not present

## 2017-12-20 DIAGNOSIS — Z6831 Body mass index (BMI) 31.0-31.9, adult: Secondary | ICD-10-CM | POA: Diagnosis not present

## 2017-12-20 DIAGNOSIS — E6609 Other obesity due to excess calories: Secondary | ICD-10-CM | POA: Diagnosis not present

## 2017-12-20 DIAGNOSIS — E119 Type 2 diabetes mellitus without complications: Secondary | ICD-10-CM | POA: Diagnosis not present

## 2018-01-17 ENCOUNTER — Ambulatory Visit: Payer: 59 | Admitting: Urology

## 2018-04-12 DIAGNOSIS — R061 Stridor: Secondary | ICD-10-CM | POA: Diagnosis not present

## 2018-04-12 DIAGNOSIS — Z79899 Other long term (current) drug therapy: Secondary | ICD-10-CM | POA: Diagnosis not present

## 2018-04-12 DIAGNOSIS — R0602 Shortness of breath: Secondary | ICD-10-CM | POA: Diagnosis not present

## 2018-04-12 DIAGNOSIS — J050 Acute obstructive laryngitis [croup]: Secondary | ICD-10-CM | POA: Diagnosis not present

## 2018-04-12 DIAGNOSIS — R509 Fever, unspecified: Secondary | ICD-10-CM | POA: Diagnosis not present

## 2018-04-12 DIAGNOSIS — B9789 Other viral agents as the cause of diseases classified elsewhere: Secondary | ICD-10-CM | POA: Diagnosis not present

## 2018-04-12 DIAGNOSIS — R05 Cough: Secondary | ICD-10-CM | POA: Diagnosis not present

## 2018-04-13 DIAGNOSIS — R509 Fever, unspecified: Secondary | ICD-10-CM | POA: Diagnosis not present

## 2018-04-13 DIAGNOSIS — J050 Acute obstructive laryngitis [croup]: Secondary | ICD-10-CM | POA: Diagnosis not present

## 2018-04-13 DIAGNOSIS — H6692 Otitis media, unspecified, left ear: Secondary | ICD-10-CM | POA: Diagnosis not present

## 2018-04-13 DIAGNOSIS — J111 Influenza due to unidentified influenza virus with other respiratory manifestations: Secondary | ICD-10-CM | POA: Diagnosis not present

## 2018-05-02 DIAGNOSIS — Z1389 Encounter for screening for other disorder: Secondary | ICD-10-CM | POA: Diagnosis not present

## 2018-05-02 DIAGNOSIS — E6609 Other obesity due to excess calories: Secondary | ICD-10-CM | POA: Diagnosis not present

## 2018-05-02 DIAGNOSIS — Z6831 Body mass index (BMI) 31.0-31.9, adult: Secondary | ICD-10-CM | POA: Diagnosis not present

## 2018-05-02 DIAGNOSIS — E119 Type 2 diabetes mellitus without complications: Secondary | ICD-10-CM | POA: Diagnosis not present

## 2018-05-21 DIAGNOSIS — J069 Acute upper respiratory infection, unspecified: Secondary | ICD-10-CM | POA: Diagnosis not present

## 2018-05-22 ENCOUNTER — Ambulatory Visit (INDEPENDENT_AMBULATORY_CARE_PROVIDER_SITE_OTHER): Payer: BLUE CROSS/BLUE SHIELD | Admitting: Adult Health

## 2018-05-22 ENCOUNTER — Encounter: Payer: Self-pay | Admitting: Adult Health

## 2018-05-22 ENCOUNTER — Other Ambulatory Visit (HOSPITAL_COMMUNITY)
Admission: RE | Admit: 2018-05-22 | Discharge: 2018-05-22 | Disposition: A | Discharge status: Home / Self Care | Payer: BLUE CROSS/BLUE SHIELD | Source: Ambulatory Visit | Attending: Adult Health | Admitting: Adult Health

## 2018-05-22 VITALS — BP 128/65 | HR 74 | Ht 60.5 in | Wt 166.0 lb

## 2018-05-22 DIAGNOSIS — Z01419 Encounter for gynecological examination (general) (routine) without abnormal findings: Secondary | ICD-10-CM | POA: Insufficient documentation

## 2018-05-22 DIAGNOSIS — Z1211 Encounter for screening for malignant neoplasm of colon: Secondary | ICD-10-CM | POA: Insufficient documentation

## 2018-05-22 DIAGNOSIS — Z1212 Encounter for screening for malignant neoplasm of rectum: Secondary | ICD-10-CM | POA: Diagnosis not present

## 2018-05-22 DIAGNOSIS — N951 Menopausal and female climacteric states: Secondary | ICD-10-CM

## 2018-05-22 LAB — HEMOCCULT GUIAC POC 1CARD (OFFICE)

## 2018-05-22 NOTE — Progress Notes (Signed)
Patient ID: Lori Kline, female   DOB: [birth year], 48 y.o.   MRN: [id number] History of Present Illness: Lori Kline is a 48 year old white female, married, G2P2 in for a well woman gyn exam and pap. PCP is Dr Hilma Favors.    Current Medications, Allergies, Past Medical History, Past Surgical History, Family History and Social History were reviewed in [employer] record.     Review of Systems:  Patient denies any headaches, hearing loss, fatigue, blurred vision, shortness of breath, chest pain, abdominal pain, problems with bowel movements, urination, or intercourse. No joint pain +mood swings, periods irregular, last one in September 2019, +hot flashes    Physical Exam:BP 128/65 (BP Location: Left Arm, Patient Position: Sitting, Cuff Size: Normal)   Pulse 74   Ht 5' 0.5" (1.537 m)   Wt 166 lb (75.3 kg)   BMI 31.89 kg/m  General:  Well developed, well nourished, no acute distress Skin:  Warm and dry Neck:  Midline trachea, normal thyroid, good ROM, no lymphadenopathy Lungs; Clear to auscultation bilaterally Breast:  No dominant palpable mass, retraction, or nipple discharge Cardiovascular: Regular rate and rhythm Abdomen:  Soft, non tender, no hepatosplenomegaly Pelvic:  External genitalia is normal in appearance, no lesions.  The vagina is normal in appearance. Urethra has no lesions or masses. The cervix is bulbous and smooth, pap with HPV.  Uterus is felt to be normal size, shape, and contour.  No adnexal masses or tenderness noted.Bladder is non tender, no masses felt. Rectal: Good sphincter tone, no polyps, or hemorrhoids felt.  Hemoccult negative. Extremities/musculoskeletal:  No swelling or varicosities noted, no clubbing or cyanosis Psych:  No mood changes, alert and cooperative,seems happy Fall risk is low. PHQ 2 score 0. Examination chaperoned by Levy Pupa LPN.  Impression: 1. Encounter for gynecological examination with Papanicolaou smear of cervix    2. Screening for colorectal cancer   3. Peri-menopause       Plan: Physical in 1 year Pap in 3 if normal Mammogram yearly Colonoscopy at 39 Labs with PCP, just had some in February

## 2018-05-25 LAB — CYTOLOGY - PAP
Adequacy: ABSENT
Diagnosis: NEGATIVE
HPV: NOT DETECTED

## 2018-09-05 ENCOUNTER — Other Ambulatory Visit (HOSPITAL_COMMUNITY): Payer: Self-pay | Admitting: Adult Health

## 2018-09-05 DIAGNOSIS — Z1231 Encounter for screening mammogram for malignant neoplasm of breast: Secondary | ICD-10-CM

## 2018-10-02 ENCOUNTER — Ambulatory Visit (HOSPITAL_COMMUNITY)
Admission: RE | Admit: 2018-10-02 | Discharge: 2018-10-02 | Disposition: A | Discharge status: Home / Self Care | Payer: BC Managed Care – PPO | Source: Ambulatory Visit | Attending: Adult Health | Admitting: Adult Health

## 2018-10-02 ENCOUNTER — Other Ambulatory Visit: Payer: Self-pay

## 2018-10-02 ENCOUNTER — Encounter (HOSPITAL_COMMUNITY): Payer: Self-pay

## 2018-10-02 DIAGNOSIS — Z1231 Encounter for screening mammogram for malignant neoplasm of breast: Secondary | ICD-10-CM | POA: Diagnosis not present

## 2018-12-14 DIAGNOSIS — Z6831 Body mass index (BMI) 31.0-31.9, adult: Secondary | ICD-10-CM | POA: Diagnosis not present

## 2018-12-14 DIAGNOSIS — Z23 Encounter for immunization: Secondary | ICD-10-CM | POA: Diagnosis not present

## 2018-12-14 DIAGNOSIS — E119 Type 2 diabetes mellitus without complications: Secondary | ICD-10-CM | POA: Diagnosis not present

## 2018-12-14 DIAGNOSIS — E6609 Other obesity due to excess calories: Secondary | ICD-10-CM | POA: Diagnosis not present

## 2019-02-01 DIAGNOSIS — Z20828 Contact with and (suspected) exposure to other viral communicable diseases: Secondary | ICD-10-CM | POA: Diagnosis not present

## 2019-05-10 DIAGNOSIS — J302 Other seasonal allergic rhinitis: Secondary | ICD-10-CM | POA: Diagnosis not present

## 2019-05-10 DIAGNOSIS — J019 Acute sinusitis, unspecified: Secondary | ICD-10-CM | POA: Diagnosis not present

## 2019-05-10 DIAGNOSIS — E119 Type 2 diabetes mellitus without complications: Secondary | ICD-10-CM | POA: Diagnosis not present

## 2019-05-10 DIAGNOSIS — Z6831 Body mass index (BMI) 31.0-31.9, adult: Secondary | ICD-10-CM | POA: Diagnosis not present

## 2019-05-10 DIAGNOSIS — E6609 Other obesity due to excess calories: Secondary | ICD-10-CM | POA: Diagnosis not present

## 2019-05-10 DIAGNOSIS — Z1389 Encounter for screening for other disorder: Secondary | ICD-10-CM | POA: Diagnosis not present

## 2019-07-11 ENCOUNTER — Encounter: Payer: Self-pay | Admitting: Adult Health

## 2019-07-11 ENCOUNTER — Ambulatory Visit (INDEPENDENT_AMBULATORY_CARE_PROVIDER_SITE_OTHER): Payer: BC Managed Care – PPO | Admitting: Adult Health

## 2019-07-11 ENCOUNTER — Other Ambulatory Visit: Payer: Self-pay

## 2019-07-11 VITALS — BP 127/88 | HR 76 | Ht 60.5 in | Wt 170.0 lb

## 2019-07-11 DIAGNOSIS — Z1211 Encounter for screening for malignant neoplasm of colon: Secondary | ICD-10-CM

## 2019-07-11 DIAGNOSIS — Z1212 Encounter for screening for malignant neoplasm of rectum: Secondary | ICD-10-CM

## 2019-07-11 DIAGNOSIS — Z01419 Encounter for gynecological examination (general) (routine) without abnormal findings: Secondary | ICD-10-CM | POA: Diagnosis not present

## 2019-07-11 LAB — HEMOCCULT GUIAC POC 1CARD (OFFICE): Fecal Occult Blood, POC: NEGATIVE

## 2019-07-11 NOTE — Progress Notes (Signed)
Patient ID: Lori Kline, female   DOB: 1970/05/27, 49 y.o.   MRN: [id number] History of Present Illness: Lori Kline is a 49 year old white female,maried, G2P2 in for well woman gyn exam, she had a normal pap with negative HPV 05/22/2018. PCP is Dr Hilma Favors.    Current Medications, Allergies, Past Medical History, Past Surgical History, Family History and Social History were reviewed in [employer] record.     Review of Systems:  Patient denies any headaches, hearing loss, fatigue, blurred vision, shortness of breath, chest pain, abdominal pain, problems with bowel movements, urination, or intercourse. No joint pain or mood swings. No vaginal bleeding   Physical Exam:BP 127/88 (BP Location: Right Arm, Patient Position: Sitting, Cuff Size: Normal)   Pulse 76   Ht 5' 0.5" (1.537 m)   Wt 170 lb (77.1 kg)   LMP 03/09/2017   BMI 32.65 kg/m  General:  Well developed, well nourished, no acute distress Skin:  Warm and dry Neck:  Midline trachea, normal thyroid, good ROM, no lymphadenopathy Lungs; Clear to auscultation bilaterally Breast:  No dominant palpable mass, retraction, or nipple discharge Cardiovascular: Regular rate and rhythm Abdomen:  Soft, non tender, no hepatosplenomegaly Pelvic:  External genitalia is normal in appearance, no lesions.  The vagina is normal in appearance. Urethra has no lesions or masses. The cervix is bulbous.  Uterus is felt to be normal size, shape, and contour.  No adnexal masses or tenderness noted.Bladder is non tender, no masses felt. Rectal: Good sphincter tone, no polyps, or hemorrhoids felt.  Hemoccult negative.Mild low rectocele. Extremities/musculoskeletal:  No swelling or varicosities noted, no clubbing or cyanosis Psych:  No mood changes, alert and cooperative,seems happy AA 0 Fall risk is low PHQ 9 score is 0 Examination chaperoned by Celene Squibb LPN  Impression and Plan: 1. Encounter for well woman exam with routine  gynecological exam Physical in 1 year Pap in 2023 Mammogram yearly Labs with PCP   2. Screening for colorectal cancer Colonoscopy or cologuard at 56

## 2019-07-26 DIAGNOSIS — Z23 Encounter for immunization: Secondary | ICD-10-CM | POA: Diagnosis not present

## 2019-08-17 DIAGNOSIS — Z23 Encounter for immunization: Secondary | ICD-10-CM | POA: Diagnosis not present

## 2019-09-04 ENCOUNTER — Other Ambulatory Visit (HOSPITAL_COMMUNITY): Payer: Self-pay | Admitting: Family Medicine

## 2019-09-04 DIAGNOSIS — Z1231 Encounter for screening mammogram for malignant neoplasm of breast: Secondary | ICD-10-CM

## 2019-10-08 ENCOUNTER — Ambulatory Visit (HOSPITAL_COMMUNITY)
Admission: RE | Admit: 2019-10-08 | Discharge: 2019-10-08 | Disposition: A | Discharge status: Home / Self Care | Payer: BC Managed Care – PPO | Source: Ambulatory Visit | Attending: Family Medicine | Admitting: Family Medicine

## 2019-10-08 ENCOUNTER — Other Ambulatory Visit: Payer: Self-pay

## 2019-10-08 DIAGNOSIS — Z1231 Encounter for screening mammogram for malignant neoplasm of breast: Secondary | ICD-10-CM | POA: Diagnosis not present

## 2019-12-06 DIAGNOSIS — E118 Type 2 diabetes mellitus with unspecified complications: Secondary | ICD-10-CM | POA: Diagnosis not present

## 2019-12-06 DIAGNOSIS — Z23 Encounter for immunization: Secondary | ICD-10-CM | POA: Diagnosis not present

## 2019-12-06 DIAGNOSIS — E6609 Other obesity due to excess calories: Secondary | ICD-10-CM | POA: Diagnosis not present

## 2019-12-06 DIAGNOSIS — Z6831 Body mass index (BMI) 31.0-31.9, adult: Secondary | ICD-10-CM | POA: Diagnosis not present

## 2019-12-11 ENCOUNTER — Encounter (INDEPENDENT_AMBULATORY_CARE_PROVIDER_SITE_OTHER): Payer: Self-pay

## 2020-01-16 ENCOUNTER — Other Ambulatory Visit (INDEPENDENT_AMBULATORY_CARE_PROVIDER_SITE_OTHER): Payer: Self-pay

## 2020-01-16 DIAGNOSIS — Z1211 Encounter for screening for malignant neoplasm of colon: Secondary | ICD-10-CM

## 2020-01-18 ENCOUNTER — Telehealth (INDEPENDENT_AMBULATORY_CARE_PROVIDER_SITE_OTHER): Payer: Self-pay

## 2020-01-18 ENCOUNTER — Encounter (INDEPENDENT_AMBULATORY_CARE_PROVIDER_SITE_OTHER): Payer: Self-pay

## 2020-01-18 MED ORDER — PLENVU 140 G PO SOLR: 1.0000 | Freq: Once | ORAL | 0 refills | Status: AC

## 2020-01-18 NOTE — Telephone Encounter (Signed)
Patient has Plenvu (copay card)

## 2020-01-18 NOTE — Telephone Encounter (Signed)
Ok to schedule.  Thanks,  Taner Rzepka Castaneda Mayorga, MD Gastroenterology and Hepatology [facility]  

## 2020-01-18 NOTE — Telephone Encounter (Signed)
Referring MD/PCP: Hilma Favors   Procedure: TCS  Reason/Indication:  Screening  Has patient had this procedure before?  no  If so, when, by whom and where?    Is there a family history of colon cancer?  no  Who?  What age when diagnosed?    Is patient diabetic?   yes      Does patient have prosthetic heart valve or mechanical valve?  no  Do you have a pacemaker/defibrillator?  no  Has patient ever had endocarditis/atrial fibrillation? no  Does patient use oxygen? no  Has patient had joint replacement within last 12 months?  no  Is patient constipated or do they take laxatives? no  Does patient have a history of alcohol/drug use?  no  Is patient on blood thinner such as Coumadin, Plavix and/or Aspirin? no  Medications: Metformin 1000mg  bid, Escitalopram 10mg  daily  Allergies: nkda  Medication Adjustment per Dr Jenetta Downer hold metformin evening before & morning of procedure  Procedure date & time: 02/19/20 10:00

## 2020-01-21 ENCOUNTER — Encounter (INDEPENDENT_AMBULATORY_CARE_PROVIDER_SITE_OTHER): Payer: Self-pay | Admitting: *Deleted

## 2020-02-18 ENCOUNTER — Other Ambulatory Visit (HOSPITAL_COMMUNITY)
Admission: RE | Admit: 2020-02-18 | Discharge: 2020-02-18 | Disposition: A | Discharge status: Home / Self Care | Payer: BC Managed Care – PPO | Source: Ambulatory Visit | Attending: Gastroenterology | Admitting: Gastroenterology

## 2020-02-18 ENCOUNTER — Other Ambulatory Visit: Payer: Self-pay

## 2020-02-18 DIAGNOSIS — K5730 Diverticulosis of large intestine without perforation or abscess without bleeding: Secondary | ICD-10-CM | POA: Diagnosis not present

## 2020-02-18 DIAGNOSIS — Z79899 Other long term (current) drug therapy: Secondary | ICD-10-CM | POA: Diagnosis not present

## 2020-02-18 DIAGNOSIS — Z20822 Contact with and (suspected) exposure to covid-19: Secondary | ICD-10-CM | POA: Insufficient documentation

## 2020-02-18 DIAGNOSIS — Z01812 Encounter for preprocedural laboratory examination: Secondary | ICD-10-CM | POA: Insufficient documentation

## 2020-02-18 DIAGNOSIS — Z7984 Long term (current) use of oral hypoglycemic drugs: Secondary | ICD-10-CM | POA: Diagnosis not present

## 2020-02-18 DIAGNOSIS — Z87891 Personal history of nicotine dependence: Secondary | ICD-10-CM | POA: Diagnosis not present

## 2020-02-18 DIAGNOSIS — Z1211 Encounter for screening for malignant neoplasm of colon: Secondary | ICD-10-CM | POA: Diagnosis not present

## 2020-02-18 LAB — SARS CORONAVIRUS 2 (TAT 6-24 HRS): SARS Coronavirus 2: NEGATIVE

## 2020-02-19 ENCOUNTER — Ambulatory Visit (HOSPITAL_COMMUNITY): Payer: BC Managed Care – PPO | Admitting: Anesthesiology

## 2020-02-19 ENCOUNTER — Encounter (HOSPITAL_COMMUNITY)
Admission: RE | Disposition: A | Discharge status: Home / Self Care | Payer: Self-pay | Source: Home / Self Care | Attending: Gastroenterology

## 2020-02-19 ENCOUNTER — Ambulatory Visit (HOSPITAL_COMMUNITY)
Admission: RE | Admit: 2020-02-19 | Discharge: 2020-02-19 | Disposition: A | Discharge status: Home / Self Care | Payer: BC Managed Care – PPO | Attending: Gastroenterology | Admitting: Gastroenterology

## 2020-02-19 ENCOUNTER — Encounter (INDEPENDENT_AMBULATORY_CARE_PROVIDER_SITE_OTHER): Payer: Self-pay | Admitting: *Deleted

## 2020-02-19 ENCOUNTER — Encounter (HOSPITAL_COMMUNITY): Payer: Self-pay | Admitting: Gastroenterology

## 2020-02-19 ENCOUNTER — Other Ambulatory Visit: Payer: Self-pay

## 2020-02-19 DIAGNOSIS — Z87891 Personal history of nicotine dependence: Secondary | ICD-10-CM | POA: Diagnosis not present

## 2020-02-19 DIAGNOSIS — Z20822 Contact with and (suspected) exposure to covid-19: Secondary | ICD-10-CM | POA: Insufficient documentation

## 2020-02-19 DIAGNOSIS — Z79899 Other long term (current) drug therapy: Secondary | ICD-10-CM | POA: Diagnosis not present

## 2020-02-19 DIAGNOSIS — Z7984 Long term (current) use of oral hypoglycemic drugs: Secondary | ICD-10-CM | POA: Diagnosis not present

## 2020-02-19 DIAGNOSIS — K5730 Diverticulosis of large intestine without perforation or abscess without bleeding: Secondary | ICD-10-CM | POA: Diagnosis not present

## 2020-02-19 DIAGNOSIS — Z1211 Encounter for screening for malignant neoplasm of colon: Secondary | ICD-10-CM | POA: Diagnosis not present

## 2020-02-19 HISTORY — PX: COLONOSCOPY WITH PROPOFOL: SHX5780

## 2020-02-19 LAB — GLUCOSE, CAPILLARY: Glucose-Capillary: 120 mg/dL — ABNORMAL HIGH (ref 70–99)

## 2020-02-19 LAB — HM COLONOSCOPY

## 2020-02-19 SURGERY — COLONOSCOPY WITH PROPOFOL
Anesthesia: General

## 2020-02-19 MED ORDER — LIDOCAINE HCL (CARDIAC) PF 100 MG/5ML IV SOSY
PREFILLED_SYRINGE | INTRAVENOUS | Status: DC | PRN
  Administered 2020-02-19: 80 mg via INTRAVENOUS

## 2020-02-19 MED ORDER — CHLORHEXIDINE GLUCONATE CLOTH 2 % EX PADS: 6.0000 | MEDICATED_PAD | Freq: Once | CUTANEOUS | Status: DC

## 2020-02-19 MED ORDER — LACTATED RINGERS IV SOLN: INTRAVENOUS | Status: DC | PRN

## 2020-02-19 MED ORDER — LACTATED RINGERS IV SOLN: Freq: Once | INTRAVENOUS | Status: AC

## 2020-02-19 MED ORDER — PROPOFOL 10 MG/ML IV BOLUS
INTRAVENOUS | Status: DC | PRN
  Administered 2020-02-19: 100 mg via INTRAVENOUS
  Administered 2020-02-19: 150 ug/kg/min via INTRAVENOUS

## 2020-02-19 NOTE — Anesthesia Preprocedure Evaluation (Addendum)
Anesthesia Evaluation  Patient identified by MRN, date of birth, ID band Patient awake and Patient confused    Reviewed: Allergy & Precautions, NPO status , Patient's Chart, lab work & pertinent test results  History of Anesthesia Complications Negative for: history of anesthetic complications  Airway Mallampati: II  TM Distance: >3 FB Neck ROM: Full    Dental  (+) Dental Advisory Given Crowns :   Pulmonary former smoker,    Pulmonary exam normal breath sounds clear to auscultation       Cardiovascular Exercise Tolerance: Good hypertension, Normal cardiovascular exam Rhythm:Regular Rate:Normal     Neuro/Psych Anxiety    GI/Hepatic negative GI ROS, Neg liver ROS,   Endo/Other  diabetes, Well Controlled, Type 2, Oral Hypoglycemic Agents  Renal/GU      Musculoskeletal negative musculoskeletal ROS (+)   Abdominal   Peds  Hematology negative hematology ROS (+)   Anesthesia Other Findings   Reproductive/Obstetrics                           Anesthesia Physical Anesthesia Plan  ASA: II  Anesthesia Plan: General   Post-op Pain Management:    Induction: Intravenous  PONV Risk Score and Plan: TIVA  Airway Management Planned: Nasal Cannula, Natural Airway and Simple Face Mask  Additional Equipment:   Intra-op Plan:   Post-operative Plan:   Informed Consent: I have reviewed the patients History and Physical, chart, labs and discussed the procedure including the risks, benefits and alternatives for the proposed anesthesia with the patient or authorized representative who has indicated his/her understanding and acceptance.     Dental advisory given  Plan Discussed with: CRNA and Surgeon  Anesthesia Plan Comments:         Anesthesia Quick Evaluation

## 2020-02-19 NOTE — Anesthesia Postprocedure Evaluation (Signed)
Anesthesia Post Note  Patient: Interior and spatial designer  Procedure(s) Performed: COLONOSCOPY WITH PROPOFOL (N/A )  Patient location during evaluation: Endoscopy Anesthesia Type: General Level of consciousness: awake, oriented, awake and alert and patient cooperative Pain management: pain level controlled Vital Signs Assessment: post-procedure vital signs reviewed and stable Respiratory status: spontaneous breathing, respiratory function stable and nonlabored ventilation Cardiovascular status: blood pressure returned to baseline and stable Postop Assessment: no headache and no backache Anesthetic complications: no   No complications documented.   Last Vitals:  Vitals:   02/19/20 0838 02/19/20 0927  BP: (!) 123/54   Pulse: 87 68  Resp: 17 13  Temp: 36.9 C 36.5 C  SpO2: 97% 98%    Last Pain:  Vitals:   02/19/20 0927  TempSrc: Oral  PainSc: 0-No pain                 Tacy Learn

## 2020-02-19 NOTE — H&P (Signed)
Lori Kline is an 49 y.o. female.   Chief Complaint: screening colonoscopy HPI: 49 year old female with past medical history of anxiety, diabetes, hypertension, coming for screening colonoscopy. The patient has never had a colonoscopy in the past.  The patient denies having any complaints such as melena, hematochezia, abdominal pain or distention, change in her bowel movement consistency or frequency, no changes in her weight recently.  No family history of colorectal cancer.  Past Medical History:  Diagnosis Date  . Anxiety   . Diabetes mellitus without complication ([place])   . Hypertension   . Irregular periods/menstrual cycles 03/26/2015  . [place] 03/26/2015  . Peri-menopause 03/26/2015    Past Surgical History:  Procedure Laterality Date  . BUNIONECTOMY    . CHOLECYSTECTOMY    . HAMMER TOE SURGERY Right 01/21/15    Family History  Problem Relation Age of Onset  . Hyperlipidemia Mother   . Hypertension Mother   . Diabetes Father   . Diabetes Paternal Grandfather    Social History:  reports that she has quit smoking. Her smoking use included cigarettes. She has never used smokeless tobacco. She reports that she does not drink alcohol and does not use drugs.  Allergies: No Known Allergies  Medications Prior to Admission  Medication Sig Dispense Refill  . cholecalciferol (VITAMIN D3) 25 MCG (1000 UNIT) tablet Take 1,000 Units by mouth daily.    Marland Kitchen escitalopram (LEXAPRO) 10 MG tablet Take 10 mg by mouth daily.     Marland Kitchen loratadine (CLARITIN) 10 MG tablet Take 10 mg by mouth daily.    . metFORMIN (GLUCOPHAGE) 1000 MG tablet Take 1,000 mg by mouth 2 (two) times daily.      Results for orders placed or performed during the hospital encounter of 02/19/20 (from the past 48 hour(s))  Glucose, capillary     Status: Abnormal   Collection Time: 02/19/20  8:43 AM  Result Value Ref Range   Glucose-Capillary 120 (H) 70 - 99 mg/dL    Comment: Glucose reference range applies only to samples  taken after fasting for at least 8 hours.   No results found.  Review of Systems  Constitutional: Negative.   HENT: Negative.   Eyes: Negative.   Respiratory: Negative.   Cardiovascular: Negative.   Gastrointestinal: Negative.   Endocrine: Negative.   Genitourinary: Negative.   Musculoskeletal: Negative.   Skin: Negative.   Allergic/Immunologic: Negative.   Neurological: Negative.   Hematological: Negative.   Psychiatric/Behavioral: Negative.     Blood pressure (!) 123/54, pulse 87, temperature 98.4 F (36.9 C), temperature source Oral, resp. rate 17, height 5\' 1"  (1.549 m), weight 74.8 kg, last menstrual period 03/09/2017, SpO2 97 %. Physical Exam  GENERAL: The patient is AO x3, in no acute distress. HEENT: Head is normocephalic and atraumatic. EOMI are intact. Mouth is well hydrated and without lesions. NECK: Supple. No masses LUNGS: Clear to auscultation. No presence of rhonchi/wheezing/rales. Adequate chest expansion HEART: RRR, normal s1 and s2. ABDOMEN: Soft, nontender, no guarding, no peritoneal signs, and nondistended. BS +. No masses. EXTREMITIES: Without any cyanosis, clubbing, rash, lesions or edema. NEUROLOGIC: AOx3, no focal motor deficit. SKIN: no jaundice, no rashes  Assessment/Plan 49 year old female with past medical history of anxiety, diabetes, hypertension, coming for screening colonoscopy. The patient is at average risk for colorectal cancer.  We will proceed with colonoscopy today.  Harvel Quale, MD 02/19/2020, 9:00 AM

## 2020-02-19 NOTE — Transfer of Care (Signed)
Immediate Anesthesia Transfer of Care Note  Patient: Lori Kline  Procedure(s) Performed: COLONOSCOPY WITH PROPOFOL (N/A )  Patient Location: Endoscopy Unit  Anesthesia Type:General  Level of Consciousness: awake, alert , oriented and patient cooperative  Airway & Oxygen Therapy: Patient Spontanous Breathing  Post-op Assessment: Report given to RN, Post -op Vital signs reviewed and stable and Patient moving all extremities  Post vital signs: Reviewed and stable  Last Vitals:  Vitals Value Taken Time  BP    Temp    Pulse    Resp    SpO2      Last Pain:  Vitals:   02/19/20 0838  TempSrc: Oral  PainSc: 8          Complications: No complications documented.

## 2020-02-19 NOTE — Discharge Instructions (Signed)
Colonoscopy, Adult, Care After This sheet gives you information about how to care for yourself after your procedure. Your doctor may also give you more specific instructions. If you have problems or questions, call your doctor. What can I expect after the procedure? After the procedure, it is common to have:  A small amount of blood in your poop (stool) for 24 hours.  Some gas.  Mild cramping or bloating in your belly (abdomen). Follow these instructions at home: Eating and drinking   Drink enough fluid to keep your pee (urine) pale yellow.  Follow instructions from your doctor about what you cannot eat or drink.  Return to your normal diet as told by your doctor. Avoid heavy or fried foods that are hard to digest. Activity  Rest as told by your doctor.  Do not sit for a long time without moving. Get up to take short walks every 1-2 hours. This is important. Ask for help if you feel weak or unsteady.  Return to your normal activities as told by your doctor. Ask your doctor what activities are safe for you. To help cramping and bloating:   Try walking around.  Put heat on your belly as told by your doctor. Use the heat source that your doctor recommends, such as a moist heat pack or a heating pad. ? Put a towel between your skin and the heat source. ? Leave the heat on for 20-30 minutes. ? Remove the heat if your skin turns bright red. This is very important if you are unable to feel pain, heat, or cold. You may have a greater risk of getting burned. General instructions  For the first 24 hours after the procedure: ? Do not drive or use machinery. ? Do not sign important documents. ? Do not drink alcohol. ? Do your daily activities more slowly than normal. ? Eat foods that are soft and easy to digest.  Take over-the-counter or prescription medicines only as told by your doctor.  Keep all follow-up visits as told by your doctor. This is important. Contact a doctor  if:  You have blood in your poop 2-3 days after the procedure. Get help right away if:  You have more than a small amount of blood in your poop.  You see large clumps of tissue (blood clots) in your poop.  Your belly is swollen.  You feel like you may vomit (nauseous).  You vomit.  You have a fever.  You have belly pain that gets worse, and medicine does not help your pain. Summary  After the procedure, it is common to have a small amount of blood in your poop. You may also have mild cramping and bloating in your belly.  For the first 24 hours after the procedure, do not drive or use machinery, do not sign important documents, and do not drink alcohol.  Get help right away if you have a lot of blood in your poop, feel like you may vomit, have a fever, or have more belly pain. This information is not intended to replace advice given to you by your health care provider. Make sure you discuss any questions you have with your health care provider. Document Revised: 09/25/2018 Document Reviewed: 09/25/2018 Elsevier Patient Education  [place]. Diverticulosis  Diverticulosis is a condition that develops when small pouches (diverticula) form in the wall of the large intestine (colon). The colon is where water is absorbed and stool (feces) is formed. The pouches form when the inside layer  of the colon pushes through weak spots in the outer layers of the colon. You may have a few pouches or many of them. The pouches usually do not cause problems unless they become inflamed or infected. When this happens, the condition is called diverticulitis. What are the causes? The cause of this condition is not known. What increases the risk? The following factors may make you more likely to develop this condition:  Being older than age 54. Your risk for this condition increases with age. Diverticulosis is rare among people younger than age 38. By age 70, many people have it.  Eating a  low-fiber diet.  Having frequent constipation.  Being overweight.  Not getting enough exercise.  Smoking.  Taking over-the-counter pain medicines, like aspirin and ibuprofen.  Having a family history of diverticulosis. What are the signs or symptoms? In most people, there are no symptoms of this condition. If you do have symptoms, they may include:  Bloating.  Cramps in the abdomen.  Constipation or diarrhea.  Pain in the lower left side of the abdomen. How is this diagnosed? Because diverticulosis usually has no symptoms, it is most often diagnosed during an exam for other colon problems. The condition may be diagnosed by:  Using a flexible scope to examine the colon (colonoscopy).  Taking an X-ray of the colon after dye has been put into the colon (barium enema).  Having a CT scan. How is this treated? You may not need treatment for this condition. Your health care provider may recommend treatment to prevent problems. You may need treatment if you have symptoms or if you previously had diverticulitis. Treatment may include:  Eating a high-fiber diet.  Taking a fiber supplement.  Taking a live bacteria supplement (probiotic).  Taking medicine to relax your colon. Follow these instructions at home: Medicines  Take over-the-counter and prescription medicines only as told by your health care provider.  If told by your health care provider, take a fiber supplement or probiotic. Constipation prevention Your condition may cause constipation. To prevent or treat constipation, you may need to:  Drink enough fluid to keep your urine pale yellow.  Take over-the-counter or prescription medicines.  Eat foods that are high in fiber, such as beans, whole grains, and fresh fruits and vegetables.  Limit foods that are high in fat and processed sugars, such as fried or sweet foods.  General instructions  Try not to strain when you have a bowel movement.  Keep all  follow-up visits as told by your health care provider. This is important. Contact a health care provider if you:  Have pain in your abdomen.  Have bloating.  Have cramps.  Have not had a bowel movement in 3 days. Get help right away if:  Your pain gets worse.  Your bloating becomes very bad.  You have a fever or chills, and your symptoms suddenly get worse.  You vomit.  You have bowel movements that are bloody or black.  You have bleeding from your rectum. Summary  Diverticulosis is a condition that develops when small pouches (diverticula) form in the wall of the large intestine (colon).  You may have a few pouches or many of them.  This condition is most often diagnosed during an exam for other colon problems.  Treatment may include increasing the fiber in your diet, taking supplements, or taking medicines. This information is not intended to replace advice given to you by your health care provider. Make sure you discuss any questions you  have with your health care provider. Document Revised: 09/28/2018 Document Reviewed: 09/28/2018 Elsevier Patient Education  2020 [place] are being discharged to home.  Eat a high fiber diet.  Your physician has recommended a repeat colonoscopy in 10 years for screening purposes.

## 2020-02-19 NOTE — Op Note (Signed)
[facility] Patient Name: Lori Kline Procedure Date: 02/19/2020 8:52 AM MRN: [id number] Date of Birth: 03-Aug-1970 Attending MD: Maylon Peppers ,  CSN: [id number] Age: 49 Admit Type: Outpatient Procedure:                Colonoscopy Indications:              Screening for colorectal malignant neoplasm Providers:                Maylon Peppers, Janeece Riggers, RN, Nelma Rothman,                            Technician Referring MD:              Medicines:                Monitored Anesthesia Care Complications:            No immediate complications. Estimated Blood Loss:     Estimated blood loss: none. Procedure:                Pre-Anesthesia Assessment:                           - Prior to the procedure, a History and Physical                            was performed, and patient medications, allergies                            and sensitivities were reviewed. The patient's                            tolerance of previous anesthesia was reviewed.                           - The risks and benefits of the procedure and the                            sedation options and risks were discussed with the                            patient. All questions were answered and informed                            consent was obtained.                           - ASA Grade Assessment: II - A patient with mild                            systemic disease.                           After obtaining informed consent, the colonoscope                            was passed under direct vision. Throughout the  procedure, the patient's blood pressure, pulse, and                            oxygen saturations were monitored continuously. The                            [id number]-[id number] ([id number]) scope was introduced through                            the anus and advanced to the the cecum, identified                            by appendiceal orifice and ileocecal valve. The                             colonoscopy was performed without difficulty. The                            patient tolerated the procedure well. The quality                            of the bowel preparation was excellent. Scope                            withdrawal time was 16 minutes. Scope In: 9:05:53 AM Scope Out: 9:25:19 AM Scope Withdrawal Time: 0 hours 16 minutes 26 seconds  Total Procedure Duration: 0 hours 19 minutes 26 seconds  Findings:      The perianal and digital rectal examinations were normal.      A few small and large-mouthed diverticula were found in the sigmoid       colon and ascending colon.      The retroflexed view of the distal rectum and anal verge was normal and       showed no anal or rectal abnormalities. Impression:               - Diverticulosis in the sigmoid colon and in the                            ascending colon.                           - The distal rectum and anal verge are normal on                            retroflexion view.                           - No specimens collected. Moderate Sedation:      Per Anesthesia Care Recommendation:           - Discharge patient to home (ambulatory).                           - High fiber diet.                           -  Repeat colonoscopy in 10 years for screening                            purposes. Procedure Code(s):        --- Professional ---                           Q9476, GC, Colorectal cancer screening; colonoscopy                            on individual not meeting criteria for high risk Diagnosis Code(s):        --- Professional ---                           Z12.11, Encounter for screening for malignant                            neoplasm of colon                           K57.30, Diverticulosis of large intestine without                            perforation or abscess without bleeding CPT copyright 2019 American Medical Association. All rights reserved. The codes documented in this report are preliminary  and upon coder review may  be revised to meet current compliance requirements. Maylon Peppers, MD Maylon Peppers,  02/19/2020 9:31:33 AM This report has been signed electronically. Number of Addenda: 0

## 2020-02-25 ENCOUNTER — Encounter (HOSPITAL_COMMUNITY): Payer: Self-pay | Admitting: Gastroenterology

## 2020-04-17 DIAGNOSIS — E7849 Other hyperlipidemia: Secondary | ICD-10-CM | POA: Diagnosis not present

## 2020-04-17 DIAGNOSIS — E663 Overweight: Secondary | ICD-10-CM | POA: Diagnosis not present

## 2020-04-17 DIAGNOSIS — Z6829 Body mass index (BMI) 29.0-29.9, adult: Secondary | ICD-10-CM | POA: Diagnosis not present

## 2020-04-17 DIAGNOSIS — Z1331 Encounter for screening for depression: Secondary | ICD-10-CM | POA: Diagnosis not present

## 2020-04-17 DIAGNOSIS — E119 Type 2 diabetes mellitus without complications: Secondary | ICD-10-CM | POA: Diagnosis not present

## 2020-04-22 DIAGNOSIS — E119 Type 2 diabetes mellitus without complications: Secondary | ICD-10-CM | POA: Diagnosis not present

## 2020-04-22 DIAGNOSIS — E7849 Other hyperlipidemia: Secondary | ICD-10-CM | POA: Diagnosis not present

## 2020-06-16 ENCOUNTER — Other Ambulatory Visit: Payer: Self-pay

## 2020-06-16 ENCOUNTER — Other Ambulatory Visit: Payer: BC Managed Care – PPO | Admitting: Adult Health

## 2020-07-22 ENCOUNTER — Encounter: Payer: Self-pay | Admitting: Adult Health

## 2020-07-22 ENCOUNTER — Other Ambulatory Visit: Payer: Self-pay

## 2020-07-22 ENCOUNTER — Ambulatory Visit (INDEPENDENT_AMBULATORY_CARE_PROVIDER_SITE_OTHER): Payer: BC Managed Care – PPO | Admitting: Adult Health

## 2020-07-22 VITALS — BP 110/66 | HR 79 | Ht 60.0 in | Wt 154.6 lb

## 2020-07-22 DIAGNOSIS — Z01419 Encounter for gynecological examination (general) (routine) without abnormal findings: Secondary | ICD-10-CM | POA: Diagnosis not present

## 2020-07-22 DIAGNOSIS — Z780 Asymptomatic menopausal state: Secondary | ICD-10-CM | POA: Insufficient documentation

## 2020-07-22 DIAGNOSIS — Z1211 Encounter for screening for malignant neoplasm of colon: Secondary | ICD-10-CM

## 2020-07-22 LAB — HEMOCCULT GUIAC POC 1CARD (OFFICE): Fecal Occult Blood, POC: NEGATIVE

## 2020-07-22 NOTE — Progress Notes (Signed)
Patient ID: Lori Kline, female   DOB: 1970-12-22, 50 y.o.   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 She works from home. PCP is Dr Hilma Favors.   Current Medications, Allergies, Past Medical History, Past Surgical History, Family History and Social History were reviewed in [employer] record.     Review of Systems: Patient denies any headaches, hearing loss, fatigue, blurred vision, shortness of breath, chest pain, abdominal pain, problems with bowel movements, urination, or intercourse. No joint pain or mood swings. No vaginal bleeding.    Physical Exam:BP 110/66 (BP Location: Right Arm, Patient Position: Sitting, Cuff Size: Normal)   Pulse 79   Ht 5' (1.524 m)   Wt 154 lb 9.6 oz (70.1 kg)   LMP 03/09/2017   BMI 30.19 kg/m  General:  Well developed, well nourished, no acute distress Skin:  Warm and dry Neck:  Midline trachea, normal thyroid, good ROM, no lymphadenopathy Lungs; Clear to auscultation bilaterally Breast:  No dominant palpable mass, retraction, or nipple discharge Cardiovascular: Regular rate and rhythm Abdomen:  Soft, non tender, no hepatosplenomegaly Pelvic:  External genitalia is normal in appearance, no lesions.  The vagina is normal in appearance. Urethra has no lesions or masses. The cervix is smooth.  Uterus is felt to be normal size, shape, and contour.  No adnexal masses or tenderness noted.Bladder is non tender, no masses felt. Rectal: Good sphincter tone, no polyps, or hemorrhoids felt.  Hemoccult negative.+rectocele Extremities/musculoskeletal:  No swelling or varicosities noted, no clubbing or cyanosis Psych:  No mood changes, alert and cooperative,seems happy AA is 0 Fall risk is low PHQ 9 score is 0 GAD 7 score is 0  [facility] - 07/22/20 1126      Pregnancy Intention Screening   Does the patient  want to become pregnant in the next year? No    Does the patient's partner want to become pregnant in the next year? No    Would the patient like to discuss contraceptive options today? No      Contraception Wrap Up   Current Method Vasectomy   Post-menopausal   End Method Vasectomy    Contraception Counseling Provided No         Examination chaperoned by Glenard Haring RN  Impression and Plan: 1. Encounter for well woman exam with routine gynecological exam Pap and physical in 1 year Mammogram yearly Colonoscopy per GI,had 2021, good for 10 years she says  Labs with PCP  2. Encounter for screening fecal occult blood testing  3. Postmenopausal

## 2020-09-05 ENCOUNTER — Other Ambulatory Visit (HOSPITAL_COMMUNITY): Payer: Self-pay | Admitting: Family Medicine

## 2020-09-05 DIAGNOSIS — Z1231 Encounter for screening mammogram for malignant neoplasm of breast: Secondary | ICD-10-CM

## 2020-09-25 DIAGNOSIS — E118 Type 2 diabetes mellitus with unspecified complications: Secondary | ICD-10-CM | POA: Diagnosis not present

## 2020-09-25 DIAGNOSIS — Z6828 Body mass index (BMI) 28.0-28.9, adult: Secondary | ICD-10-CM | POA: Diagnosis not present

## 2020-09-25 DIAGNOSIS — E663 Overweight: Secondary | ICD-10-CM | POA: Diagnosis not present

## 2020-09-25 DIAGNOSIS — Z0001 Encounter for general adult medical examination with abnormal findings: Secondary | ICD-10-CM | POA: Diagnosis not present

## 2020-09-25 DIAGNOSIS — E782 Mixed hyperlipidemia: Secondary | ICD-10-CM | POA: Diagnosis not present

## 2020-10-06 IMAGING — MG DIGITAL SCREENING BILAT W/ TOMO W/ CAD
6 of 12 series · 6 of 36 positions shown · non-contrast
Comparison: Previous exam(s).

CLINICAL DATA: Screening.

EXAM:
DIGITAL SCREENING BILATERAL MAMMOGRAM WITH TOMO AND CAD

[L MLO synth-2D (1 of 2)]
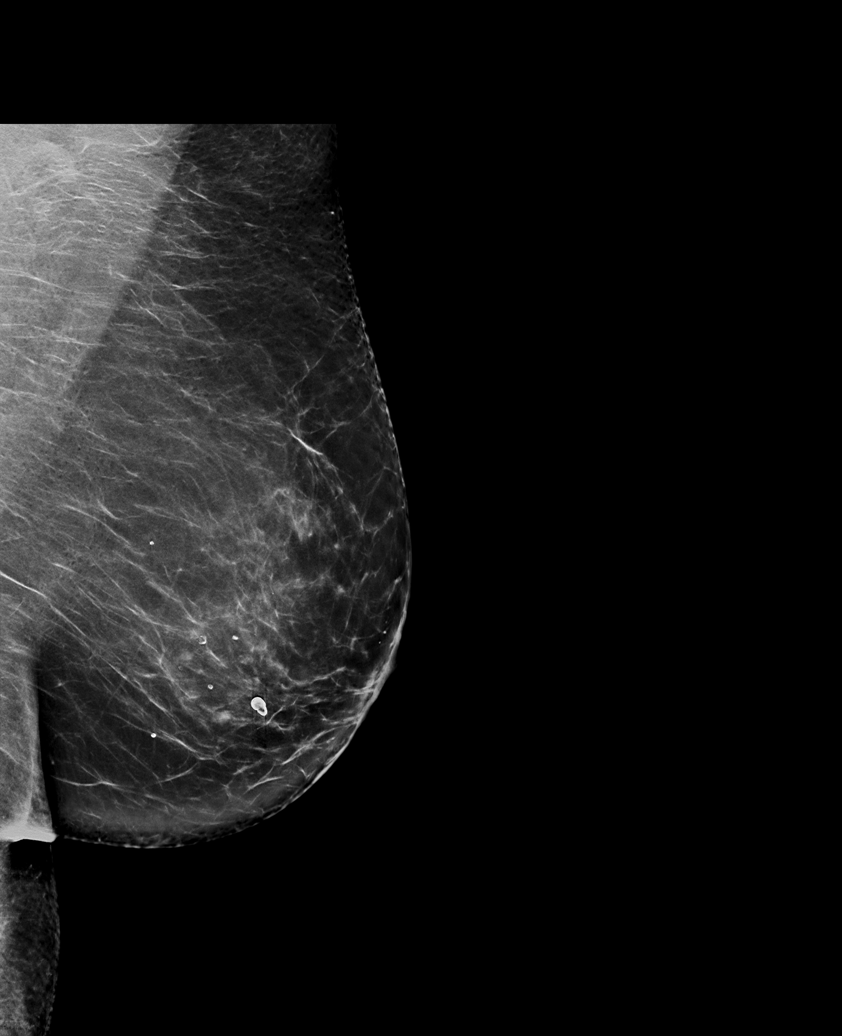

[L MLO synth-2D (2 of 2)]
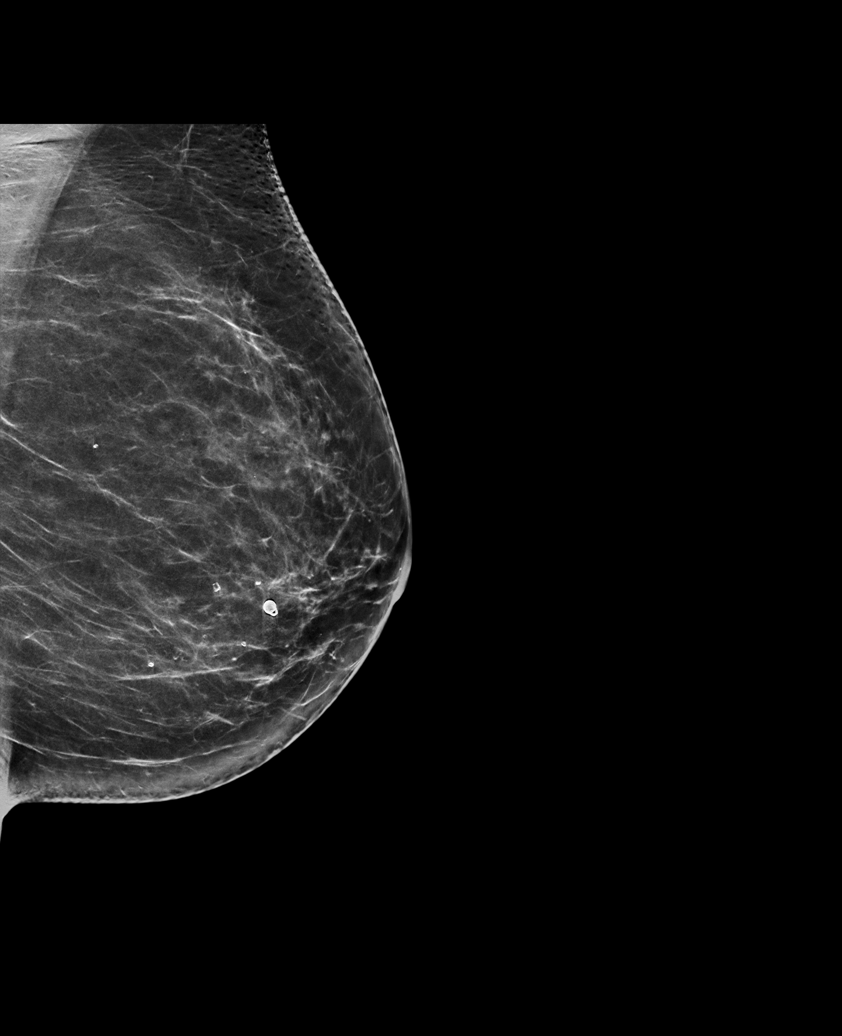

[R MLO synth-2D (1 of 2)]
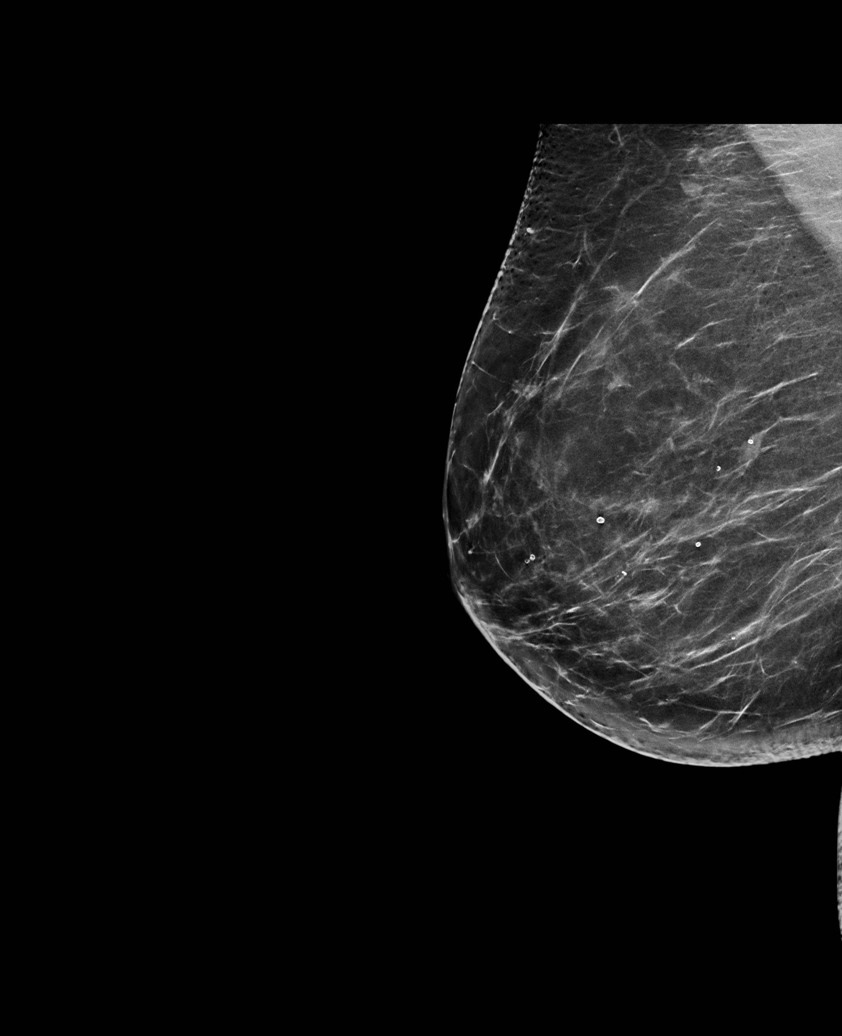

[R CC synth-2D]
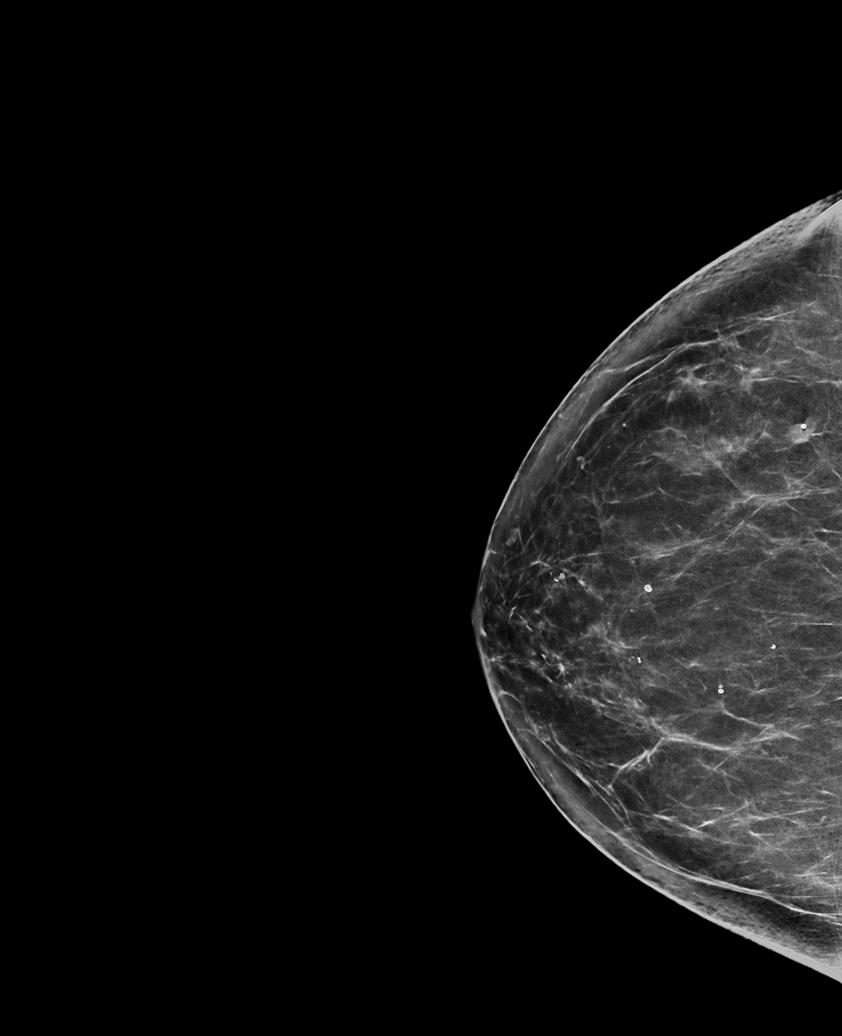

[L CC synth-2D]
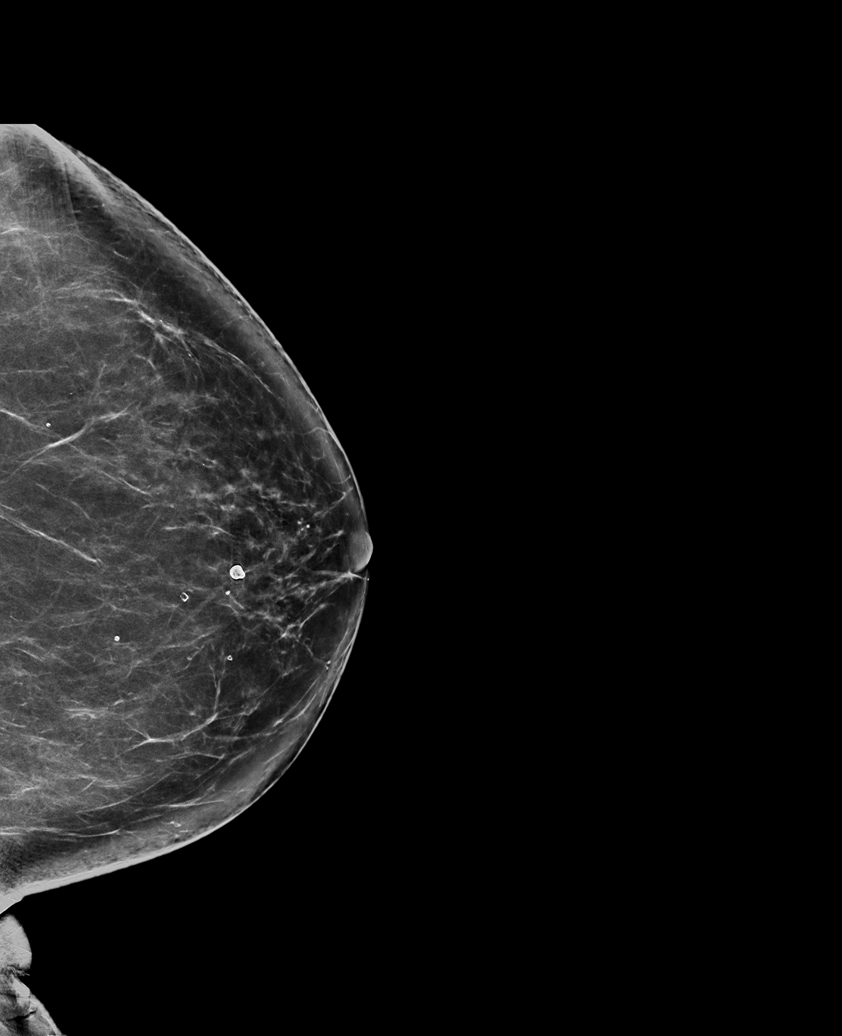

[R MLO synth-2D (2 of 2)]
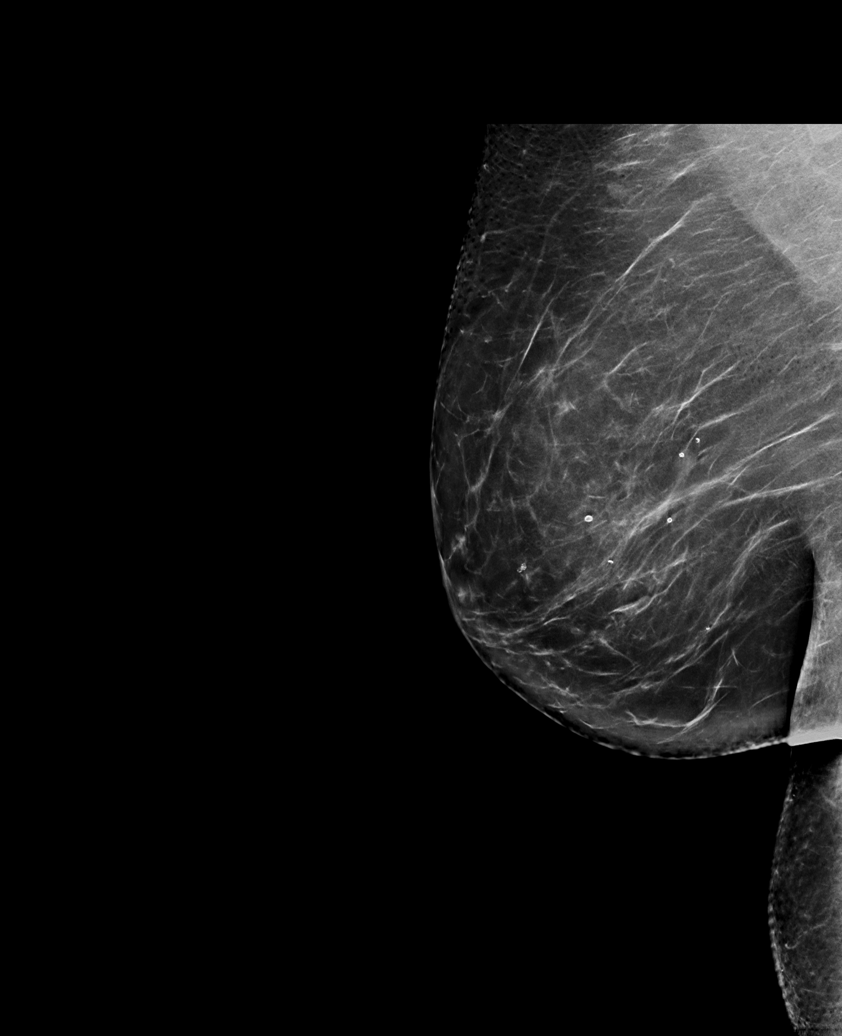

[6 of 36 positions shown; findings below may reference images not displayed]

ACR Breast Density Category b: There are scattered areas of
fibroglandular density.
FINDINGS: There are no findings suspicious for malignancy. Images were
processed with CAD.
IMPRESSION: No mammographic evidence of malignancy. A result letter of this
screening mammogram will be mailed directly to the patient.

RECOMMENDATION:
Screening mammogram in one year. (Code:[id number]-775)

BI-RADS CATEGORY  1: Negative.

## 2020-10-13 ENCOUNTER — Other Ambulatory Visit: Payer: Self-pay

## 2020-10-13 ENCOUNTER — Ambulatory Visit (HOSPITAL_COMMUNITY)
Admission: RE | Admit: 2020-10-13 | Discharge: 2020-10-13 | Disposition: A | Discharge status: Home / Self Care | Payer: BC Managed Care – PPO | Source: Ambulatory Visit | Attending: Family Medicine | Admitting: Family Medicine

## 2020-10-13 DIAGNOSIS — Z1231 Encounter for screening mammogram for malignant neoplasm of breast: Secondary | ICD-10-CM | POA: Diagnosis not present

## 2021-04-02 DIAGNOSIS — E118 Type 2 diabetes mellitus with unspecified complications: Secondary | ICD-10-CM | POA: Diagnosis not present

## 2021-04-02 DIAGNOSIS — E663 Overweight: Secondary | ICD-10-CM | POA: Diagnosis not present

## 2021-04-02 DIAGNOSIS — Z6830 Body mass index (BMI) 30.0-30.9, adult: Secondary | ICD-10-CM | POA: Diagnosis not present

## 2021-04-02 DIAGNOSIS — E782 Mixed hyperlipidemia: Secondary | ICD-10-CM | POA: Diagnosis not present

## 2021-04-02 DIAGNOSIS — Z23 Encounter for immunization: Secondary | ICD-10-CM | POA: Diagnosis not present

## 2021-04-02 DIAGNOSIS — E669 Obesity, unspecified: Secondary | ICD-10-CM | POA: Diagnosis not present

## 2021-07-23 ENCOUNTER — Encounter: Payer: Self-pay | Admitting: Adult Health

## 2021-07-23 ENCOUNTER — Ambulatory Visit (INDEPENDENT_AMBULATORY_CARE_PROVIDER_SITE_OTHER): Payer: BC Managed Care – PPO | Admitting: Adult Health

## 2021-07-23 ENCOUNTER — Other Ambulatory Visit (HOSPITAL_COMMUNITY)
Admission: RE | Admit: 2021-07-23 | Discharge: 2021-07-23 | Disposition: A | Discharge status: Home / Self Care | Payer: BC Managed Care – PPO | Source: Ambulatory Visit | Attending: Adult Health | Admitting: Adult Health

## 2021-07-23 VITALS — BP 118/82 | HR 61 | Ht 60.0 in | Wt 157.0 lb

## 2021-07-23 DIAGNOSIS — Z780 Asymptomatic menopausal state: Secondary | ICD-10-CM

## 2021-07-23 DIAGNOSIS — Z01419 Encounter for gynecological examination (general) (routine) without abnormal findings: Secondary | ICD-10-CM | POA: Insufficient documentation

## 2021-07-23 DIAGNOSIS — Z1211 Encounter for screening for malignant neoplasm of colon: Secondary | ICD-10-CM

## 2021-07-23 LAB — HEMOCCULT GUIAC POC 1CARD (OFFICE): Fecal Occult Blood, POC: NEGATIVE

## 2021-07-23 NOTE — Progress Notes (Signed)
Patient ID: Lori Kline, female   DOB: 1971/02/11, 51 y.o.   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 ?She works from home. ?PCP is Dr Hilma Favors. ? ? ?Current Medications, Allergies, Past Medical History, Past Surgical History, Family History and Social History were reviewed in [employer] record.   ? ? ?Review of Systems: ? ?Patient denies any headaches, hearing loss, fatigue, blurred vision, shortness of breath, chest pain, abdominal pain, problems with bowel movements, urination, or intercourse. No joint pain or mood swings.  ?Denies any vaginal bleeding ? ?Physical Exam:BP 118/82 (BP Location: Right Arm, Patient Position: Sitting, Cuff Size: Normal)   Pulse 61   Ht 5' (1.524 m)   Wt 157 lb (71.2 kg)   LMP 03/09/2017   BMI 30.66 kg/m?   ?General:  Well developed, well nourished, no acute distress ?Skin:  Warm and dry ?Neck:  Midline trachea, normal thyroid, good ROM, no lymphadenopathy ?Lungs; Clear to auscultation bilaterally ?Breast:  No dominant palpable mass, retraction, or nipple discharge ?Cardiovascular: Regular rate and rhythm ?Abdomen:  Soft, non tender, no hepatosplenomegaly ?Pelvic:  External genitalia is normal in appearance, no lesions.  The vagina is normal in appearance. Urethra has no lesions or masses. The cervix is bulbous. Pap with HR HPV genotyping performed. Uterus is felt to be normal size, shape, and contour.  No adnexal masses or tenderness noted.Bladder is non tender, no masses felt. ?Rectal: Good sphincter tone, no polyps, or hemorrhoids felt.  Hemoccult negative. ?Extremities/musculoskeletal:  No swelling or varicosities noted, no clubbing or cyanosis ?Psych:  No mood changes, alert and cooperative,seems happy ?AA is 0 ?Fall risk is low ? ?  07/23/2021  ?  8:35 AM 07/22/2020  ? 11:27 AM 07/11/2019  ?  3:40 PM  ?Depression screen PHQ 2/9  ?Decreased Interest 0 0 0   ?Down, Depressed, Hopeless 0 0 0  ?PHQ - 2 Score 0 0 0  ?Altered sleeping 0 0 0  ?Tired, decreased energy 0 0 0  ?Change in appetite 0 0 0  ?Feeling bad or failure about yourself  0 0 0  ?Trouble concentrating 0 0 0  ?Moving slowly or fidgety/restless 0 0 0  ?Suicidal thoughts 0 0 0  ?PHQ-9 Score 0 0 0  ?  ? ?  07/23/2021  ?  8:35 AM 07/22/2020  ? 11:27 AM 07/11/2019  ?  3:40 PM  ?GAD 7 : Generalized Anxiety Score  ?Nervous, Anxious, on Edge 0 0 0  ?Control/stop worrying 0 0 0  ?Worry too much - different things 0 0 0  ?Trouble relaxing 0 0 0  ?Restless 0 0 0  ?Easily annoyed or irritable 0 0 0  ?Afraid - awful might happen 0 0 0  ?Total GAD 7 Score 0 0 0  ? ? [facility] - 07/23/21 0845   ? ?  ? Pregnancy Intention Screening  ? Does the patient want to become pregnant in the next year? N/A   ? Does the patient's partner want to become pregnant in the next year? N/A   ? Would the patient like to discuss contraceptive options today? N/A   ?  ? Contraception Wrap Up  ? Current Method Vasectomy   PM  ? End Method Vasectomy   PM  ? Contraception Counseling Provided No   ? ?  ?  ? ?  ?  ? Examination chaperoned by  Celene Squibb LPN ? ?Impression and Plan: ?1. Encounter for gynecological examination with Papanicolaou smear of cervix ?Pap sent ?Physical in 1 year ?Pap in 3 years if normal ?Labs with PCP this summer ?Mammogram was normal 10/13/20, get yearly ?Colonoscopy was in 2021  ? ?2. Encounter for screening fecal occult blood testing ?Hemoccult negative ? ?3. Postmenopausal ? ? ? ? ?  ?  ?

## 2021-07-27 LAB — CYTOLOGY - PAP
Comment: NEGATIVE
Diagnosis: NEGATIVE
High risk HPV: NEGATIVE

## 2021-08-20 ENCOUNTER — Ambulatory Visit (HOSPITAL_COMMUNITY): Payer: BC Managed Care – PPO

## 2021-08-20 ENCOUNTER — Ambulatory Visit
Admission: EM | Admit: 2021-08-20 | Discharge: 2021-08-20 | Disposition: A | Discharge status: Home / Self Care | Payer: BC Managed Care – PPO

## 2021-08-20 DIAGNOSIS — R2232 Localized swelling, mass and lump, left upper limb: Secondary | ICD-10-CM

## 2021-08-20 NOTE — ED Provider Notes (Signed)
[facility]    CSN: [id number] Arrival date & time: 08/20/21  1228      History   Chief Complaint Chief Complaint  Patient presents with   Abscess    HPI Lori Kline is a 51 y.o. female.   Patient presenting today with a mass that she felt in her left underarm since yesterday.  She states it has not changed since noticing it.  Denies any injury to the area, recent skin injury or bite, breast mass, redness, swelling, fever, drainage.  Has not tried anything since onset of symptoms.  Called her PCP but was unable to get appointment until the 21st.    Past Medical History:  Diagnosis Date   Anxiety    Diabetes mellitus without complication ([place])    Hypertension    Irregular periods/menstrual cycles 03/26/2015   [place] 03/26/2015   Peri-menopause 03/26/2015    Patient Active Problem List   Diagnosis Date Noted   Encounter for screening fecal occult blood testing 07/22/2020   Postmenopausal 07/22/2020   Encounter for well woman exam with routine gynecological exam 07/11/2019   Screening for colorectal cancer 05/22/2018   Encounter for gynecological examination with Papanicolaou smear of cervix 05/22/2018   Mixed stress and urge urinary incontinence 05/18/2017   Well woman exam with routine gynecological exam 05/18/2017   [facility] 03/26/2015   Irregular periods/menstrual cycles 03/26/2015   Peri-menopause 03/26/2015    Past Surgical History:  Procedure Laterality Date   BUNIONECTOMY     CHOLECYSTECTOMY     COLONOSCOPY WITH PROPOFOL N/A 02/19/2020   Procedure: COLONOSCOPY WITH PROPOFOL;  Surgeon: Harvel Quale, MD;  Location: [facility];  Service: Gastroenterology;  Laterality: N/A;  10:00   HAMMER TOE SURGERY Right 01/21/15    OB History     Gravida  2   Para  2   Term  0   Preterm      AB      Living  2      SAB      IAB      Ectopic      Multiple      Live Births  2            Home Medications    Prior to  Admission medications   Medication Sig Start Date End Date Taking? Authorizing Provider  cholecalciferol (VITAMIN D3) 25 MCG (1000 UNIT) tablet Take 1,000 Units by mouth daily. Patient not taking: Reported on 07/23/2021    [provider]  escitalopram (LEXAPRO) 10 MG tablet Take 10 mg by mouth daily.  02/16/13   [provider]  loratadine (CLARITIN) 10 MG tablet Take 10 mg by mouth daily. Patient not taking: Reported on 07/23/2021    [provider]  metFORMIN (GLUCOPHAGE) 1000 MG tablet Take 1,000 mg by mouth 2 (two) times daily. 12/06/19   [provider]    Family History Family History  Problem Relation Age of Onset   Hyperlipidemia Mother    Hypertension Mother    Diabetes Father    Diabetes Paternal Grandfather     Social History Social History   Tobacco Use   Smoking status: Former    Types: Cigarettes   Smokeless tobacco: Never  Vaping Use   Vaping Use: Never used  Substance Use Topics   Alcohol use: No   Drug use: No     Allergies   Patient has no known allergies.   Review of Systems Review of Systems Per  HPI  Physical Exam Triage Vital Signs ED Triage Vitals  Enc Vitals Group     BP 08/20/21 1340 132/85     Pulse Rate 08/20/21 1340 87     Resp 08/20/21 1340 16     Temp 08/20/21 1340 97.9 F (36.6 C)     Temp Source 08/20/21 1340 Oral     SpO2 08/20/21 1340 97 %     Weight --      Height --      Head Circumference --      Peak Flow --      Pain Score 08/20/21 1339 2     Pain Loc --      Pain Edu? --      Excl. in [place]? --    No data found.  Updated Vital Signs BP 132/85 (BP Location: Right Arm)   Pulse 87   Temp 97.9 F (36.6 C) (Oral)   Resp 16   LMP 03/09/2017   SpO2 97%   Visual Acuity Right Eye Distance:   Left Eye Distance:   Bilateral Distance:    Right Eye Near:   Left Eye Near:    Bilateral Near:     Physical Exam Vitals and nursing note reviewed.  Constitutional:      Appearance:  Normal appearance. She is not ill-appearing.  HENT:     Head: Atraumatic.  Eyes:     Extraocular Movements: Extraocular movements intact.     Conjunctiva/sclera: Conjunctivae normal.  Cardiovascular:     Rate and Rhythm: Normal rate and regular rhythm.     Heart sounds: Normal heart sounds.  Pulmonary:     Effort: Pulmonary effort is normal.     Breath sounds: Normal breath sounds.  Musculoskeletal:        General: Normal range of motion.     Cervical back: Normal range of motion and neck supple.  Skin:    General: Skin is warm and dry.     Findings: No erythema.     Comments: No erythema, edema to the left axilla, palpable smooth mobile mass about 2 cm in diameter in this area toward the posterior aspect  Neurological:     Mental Status: She is alert and oriented to person, place, and time.  Psychiatric:        Mood and Affect: Mood normal.        Thought Content: Thought content normal.        Judgment: Judgment normal.      UC Treatments / Results  Labs (all labs ordered are listed, but only abnormal results are displayed) Labs Reviewed - No data to display  EKG   Radiology No results found.  Procedures Procedures (including critical care time)  Medications Ordered in UC Medications - No data to display  Initial Impression / Assessment and Plan / UC Course  I have reviewed the triage vital signs and the nursing notes.  Pertinent labs & imaging results that were available during my care of the patient were reviewed by me and considered in my medical decision making (see chart for details).     Discussed need for ultrasound to further evaluate what this may represent, whether lymphadenopathy versus cystic type mass.  She has something scheduled with her primary care in the near future, discussed warm compresses and ibuprofen until then.  Does not appear to be a bacterial infection at this time.  ED for worsening symptoms at any time.  Final Clinical  Impressions(s) / UC  Diagnoses   Final diagnoses:  Axillary mass, left   Discharge Instructions   None    ED Prescriptions   None    PDMP not reviewed this encounter.   Volney American, [place] 08/20/21 1406

## 2021-08-20 NOTE — ED Triage Notes (Signed)
Pt report having a bump in the left axilla x 1 day.

## 2021-09-01 ENCOUNTER — Other Ambulatory Visit (HOSPITAL_COMMUNITY): Payer: Self-pay | Admitting: Family Medicine

## 2021-09-01 DIAGNOSIS — Z1231 Encounter for screening mammogram for malignant neoplasm of breast: Secondary | ICD-10-CM

## 2021-09-02 DIAGNOSIS — E782 Mixed hyperlipidemia: Secondary | ICD-10-CM | POA: Diagnosis not present

## 2021-09-02 DIAGNOSIS — Z6829 Body mass index (BMI) 29.0-29.9, adult: Secondary | ICD-10-CM | POA: Diagnosis not present

## 2021-09-02 DIAGNOSIS — E669 Obesity, unspecified: Secondary | ICD-10-CM | POA: Diagnosis not present

## 2021-09-02 DIAGNOSIS — J329 Chronic sinusitis, unspecified: Secondary | ICD-10-CM | POA: Diagnosis not present

## 2021-09-02 DIAGNOSIS — E663 Overweight: Secondary | ICD-10-CM | POA: Diagnosis not present

## 2021-09-02 DIAGNOSIS — E118 Type 2 diabetes mellitus with unspecified complications: Secondary | ICD-10-CM | POA: Diagnosis not present

## 2021-10-08 ENCOUNTER — Telehealth: Payer: Self-pay | Admitting: Adult Health

## 2021-10-08 NOTE — Telephone Encounter (Signed)
Pt states she was to call if she felt menopause symptoms and  bleeding. Pt would like a call back.

## 2021-10-08 NOTE — Telephone Encounter (Signed)
Pt hasn't had a period in 4 years or so. She started bleeding yesterday and is still bleeding. I spoke with JAG. Pt needs an appt with Anderson Malta. Pt advised to call back around 1:45 to schedule an appt. [place]

## 2021-10-13 ENCOUNTER — Ambulatory Visit: Payer: BC Managed Care – PPO | Admitting: Adult Health

## 2021-10-13 ENCOUNTER — Encounter: Payer: Self-pay | Admitting: Adult Health

## 2021-10-13 VITALS — BP 124/78 | HR 75 | Ht 60.0 in | Wt 157.5 lb

## 2021-10-13 DIAGNOSIS — N950 Postmenopausal bleeding: Secondary | ICD-10-CM

## 2021-10-13 NOTE — Progress Notes (Signed)
  Subjective:     Patient ID: Lori Kline, female   DOB: 24-Sep-1970, 51 y.o.   MRN: [id number]  HPI Lori Kline is a 51 year old white female, married, PM in complaining of vaginal bleeding for 6 days, started pink, then red, light now, felt bloated,mild cramping and low back pain like when had periods.  Lab Results  Component Value Date   DIAGPAP  07/23/2021    - Negative for intraepithelial lesion or malignancy (NILM)   HPV NOT DETECTED 05/22/2018   [place] Negative 07/23/2021   PCP is Dr Hilma Favors.  Review of Systems Vaginal bleeding for 6 days Felt bloated, mild cramps and low back like when had periods Reviewed past medical,surgical, social and family history. Reviewed medications and allergies.     Objective:   Physical Exam BP 124/78 (BP Location: Left Arm, Patient Position: Sitting, Cuff Size: Normal)   Pulse 75   Ht 5' (1.524 m)   Wt 157 lb 8 oz (71.4 kg)   LMP 03/09/2017   BMI 30.76 kg/m     Skin warm and dry.Pelvic: external genitalia is normal in appearance no lesions, vagina: pale pink,urethra has no lesions or masses noted, cervix:smooth, +scant blood at os, uterus: normal size, shape and contour, non tender, no masses felt, adnexa: no masses or tenderness noted. Bladder is non tender and no masses felt.   Upstream - 10/13/21 1101       Pregnancy Intention Screening   Does the patient want to become pregnant in the next year? No    Does the patient's partner want to become pregnant in the next year? No    Would the patient like to discuss contraceptive options today? No      Contraception Wrap Up   Current Method Vasectomy    End Method Vasectomy    Contraception Counseling Provided No            Examination chaperoned by Levy Pupa LPN  Assessment:      1. PMB (postmenopausal bleeding) Pelvic US scheduled for 10/19/21 at [facility] at 11:30 am to assess uterine lining, dicussed if thickened will need biopsy - US PELVIC COMPLETE WITH TRANSVAGINAL; Future     Review handout on PMB   Plan:      Follow upTBD

## 2021-10-15 ENCOUNTER — Ambulatory Visit (HOSPITAL_COMMUNITY)
Admission: RE | Admit: 2021-10-15 | Discharge: 2021-10-15 | Disposition: A | Discharge status: Home / Self Care | Payer: BC Managed Care – PPO | Source: Ambulatory Visit | Attending: Family Medicine | Admitting: Family Medicine

## 2021-10-15 DIAGNOSIS — Z1231 Encounter for screening mammogram for malignant neoplasm of breast: Secondary | ICD-10-CM | POA: Diagnosis not present

## 2021-10-19 ENCOUNTER — Other Ambulatory Visit (HOSPITAL_COMMUNITY): Payer: Self-pay | Admitting: Family Medicine

## 2021-10-19 ENCOUNTER — Ambulatory Visit (HOSPITAL_COMMUNITY)
Admission: RE | Admit: 2021-10-19 | Discharge: 2021-10-19 | Disposition: A | Discharge status: Home / Self Care | Payer: BC Managed Care – PPO | Source: Ambulatory Visit | Attending: Adult Health | Admitting: Adult Health

## 2021-10-19 DIAGNOSIS — N950 Postmenopausal bleeding: Secondary | ICD-10-CM | POA: Diagnosis not present

## 2021-10-19 DIAGNOSIS — R928 Other abnormal and inconclusive findings on diagnostic imaging of breast: Secondary | ICD-10-CM

## 2021-10-20 ENCOUNTER — Telehealth: Payer: Self-pay | Admitting: Adult Health

## 2021-10-20 DIAGNOSIS — N950 Postmenopausal bleeding: Secondary | ICD-10-CM

## 2021-10-20 NOTE — Telephone Encounter (Signed)
Pt aware that US showed thickened endometrium 8.3 mm will get scheduled for endometrial biopsy

## 2021-10-23 ENCOUNTER — Other Ambulatory Visit (HOSPITAL_COMMUNITY)
Admission: RE | Admit: 2021-10-23 | Discharge: 2021-10-23 | Disposition: A | Discharge status: Home / Self Care | Payer: BC Managed Care – PPO | Source: Ambulatory Visit | Attending: Obstetrics & Gynecology | Admitting: Obstetrics & Gynecology

## 2021-10-23 ENCOUNTER — Encounter: Payer: Self-pay | Admitting: Obstetrics & Gynecology

## 2021-10-23 ENCOUNTER — Ambulatory Visit: Payer: BC Managed Care – PPO | Admitting: Obstetrics & Gynecology

## 2021-10-23 VITALS — BP 120/74 | HR 95 | Wt 155.6 lb

## 2021-10-23 DIAGNOSIS — N950 Postmenopausal bleeding: Secondary | ICD-10-CM

## 2021-10-23 NOTE — Progress Notes (Signed)
GYN VISIT Patient name: Lori Kline MRN [id number]  Date of birth: [birth year] Chief Complaint:   endometrial biopsy  History of Present Illness:   Lori Kline is a 51 y.o. G66P0002 PM female being seen today for the following concerns:.     PMB: Noted bleeding that started during her beach vacation- July 26- lasted for about 6 days like a "normal" period.  Started light then moderate for a few days then stopped.  Denies bleeding currently.  Denies pelvic pain.  No acute gyn concerns or complaints  Patient's last menstrual period was 03/09/2017.     07/23/2021    8:35 AM 07/22/2020   11:27 AM 07/11/2019    3:40 PM 05/22/2018    3:29 PM 05/18/2017    3:50 PM  Depression screen PHQ 2/9  Decreased Interest 0 0 0 0 0  Down, Depressed, Hopeless 0 0 0 0 0  PHQ - 2 Score 0 0 0 0 0  Altered sleeping 0 0 0    Tired, decreased energy 0 0 0    Change in appetite 0 0 0    Feeling bad or failure about yourself  0 0 0    Trouble concentrating 0 0 0    Moving slowly or fidgety/restless 0 0 0    Suicidal thoughts 0 0 0    PHQ-9 Score 0 0 0       Review of Systems:   Pertinent items are noted in HPI Denies fever/chills, dizziness, headaches, visual disturbances, fatigue, shortness of breath, chest pain, abdominal pain, vomiting, no problems with bowel movements, urination, or intercourse unless otherwise stated above.  Pertinent History Reviewed:  Reviewed past medical,surgical, social, obstetrical and family history.  Reviewed problem list, medications and allergies. Physical Assessment:   Vitals:   10/23/21 1250  BP: 120/74  Pulse: 95  Weight: 155 lb 9.6 oz (70.6 kg)  Body mass index is 30.39 kg/m.       Physical Examination:   General appearance: alert, well appearing, and in no distress  Psych: mood appropriate, normal affect  Skin: warm & dry   Cardiovascular: normal heart rate noted  Respiratory: normal respiratory effort, no distress  Abdomen: soft, non-tender    Pelvic: VULVA: normal appearing vulva with no masses, tenderness or lesions, VAGINA: normal appearing vagina with normal color and discharge, no lesions, CERVIX: normal appearing cervix without discharge or lesions, UTERUS: uterus is normal size, shape, consistency and nontender  Extremities: no edema   Chaperone: Alice Rieger    Endometrial Biopsy Procedure Note  Pre-operative Diagnosis: PMB  Post-operative Diagnosis: same  Procedure Details  The risks (including infection, bleeding, pain, and uterine perforation) and benefits of the procedure were explained to the patient and Written informed consent was obtained.  Antibiotic prophylaxis against endocarditis was not indicated.   The patient was placed in the dorsal lithotomy position.  Bimanual exam showed the uterus to be in the neutral position.  A speculum inserted in the vagina, and the cervix prepped with betadine.     A single tooth tenaculum was applied to the anterior lip of the cervix for stabilization.  Uterus sounded to 7cm, a Pipelle endometrial aspirator was used to sample the endometrium.  Sample was sent for pathologic examination.  Condition: Stable  Complications: None   Assessment & Plan:  1) Postmenopausal bleeding -discussed recent US and work up to include EMB -EMB collected as above, further management pending results -reviewed precautions- patient was advised to call for  any fever or for prolonged or severe pain or bleeding. She was advised to use OTC analgesics as needed for mild to moderate pain. She was advised to avoid vaginal intercourse for 48 hours or until the bleeding has completely stopped. -if no further bleeding follow up in 29yrfor annual  Return in about 1 year (around 10/24/2022) for Annual.   JJanyth Pupa DO Attending [place]

## 2021-10-26 LAB — SURGICAL PATHOLOGY

## 2021-11-02 ENCOUNTER — Ambulatory Visit (HOSPITAL_COMMUNITY)
Admission: RE | Admit: 2021-11-02 | Discharge: 2021-11-02 | Disposition: A | Discharge status: Home / Self Care | Payer: BC Managed Care – PPO | Source: Ambulatory Visit | Attending: Family Medicine | Admitting: Family Medicine

## 2021-11-02 ENCOUNTER — Other Ambulatory Visit (HOSPITAL_COMMUNITY): Payer: BC Managed Care – PPO

## 2021-11-02 ENCOUNTER — Encounter (HOSPITAL_COMMUNITY): Payer: Self-pay

## 2021-11-02 DIAGNOSIS — R922 Inconclusive mammogram: Secondary | ICD-10-CM | POA: Diagnosis not present

## 2021-11-02 DIAGNOSIS — R928 Other abnormal and inconclusive findings on diagnostic imaging of breast: Secondary | ICD-10-CM

## 2021-11-10 ENCOUNTER — Encounter (HOSPITAL_COMMUNITY): Payer: BC Managed Care – PPO

## 2021-11-10 ENCOUNTER — Other Ambulatory Visit (HOSPITAL_COMMUNITY): Payer: BC Managed Care – PPO

## 2021-12-14 DIAGNOSIS — M7742 Metatarsalgia, left foot: Secondary | ICD-10-CM | POA: Diagnosis not present

## 2021-12-14 DIAGNOSIS — M79671 Pain in right foot: Secondary | ICD-10-CM | POA: Diagnosis not present

## 2021-12-14 DIAGNOSIS — M21612 Bunion of left foot: Secondary | ICD-10-CM | POA: Diagnosis not present

## 2021-12-14 DIAGNOSIS — M79672 Pain in left foot: Secondary | ICD-10-CM | POA: Diagnosis not present

## 2021-12-29 DIAGNOSIS — M2042 Other hammer toe(s) (acquired), left foot: Secondary | ICD-10-CM | POA: Diagnosis not present

## 2021-12-29 DIAGNOSIS — M21612 Bunion of left foot: Secondary | ICD-10-CM | POA: Diagnosis not present

## 2021-12-29 DIAGNOSIS — M7742 Metatarsalgia, left foot: Secondary | ICD-10-CM | POA: Diagnosis not present

## 2021-12-29 DIAGNOSIS — G8918 Other acute postprocedural pain: Secondary | ICD-10-CM | POA: Diagnosis not present

## 2021-12-29 DIAGNOSIS — M25375 Other instability, left foot: Secondary | ICD-10-CM | POA: Diagnosis not present

## 2021-12-29 DIAGNOSIS — M24275 Disorder of ligament, left foot: Secondary | ICD-10-CM | POA: Diagnosis not present

## 2022-02-12 DIAGNOSIS — M21612 Bunion of left foot: Secondary | ICD-10-CM | POA: Diagnosis not present

## 2022-03-17 DIAGNOSIS — M21612 Bunion of left foot: Secondary | ICD-10-CM | POA: Diagnosis not present

## 2022-04-12 DIAGNOSIS — E7849 Other hyperlipidemia: Secondary | ICD-10-CM | POA: Diagnosis not present

## 2022-04-12 DIAGNOSIS — E119 Type 2 diabetes mellitus without complications: Secondary | ICD-10-CM | POA: Diagnosis not present

## 2022-04-12 DIAGNOSIS — E782 Mixed hyperlipidemia: Secondary | ICD-10-CM | POA: Diagnosis not present

## 2022-04-12 DIAGNOSIS — Z0001 Encounter for general adult medical examination with abnormal findings: Secondary | ICD-10-CM | POA: Diagnosis not present

## 2022-04-12 DIAGNOSIS — E663 Overweight: Secondary | ICD-10-CM | POA: Diagnosis not present

## 2022-04-12 DIAGNOSIS — Z1331 Encounter for screening for depression: Secondary | ICD-10-CM | POA: Diagnosis not present

## 2022-04-12 DIAGNOSIS — Z6829 Body mass index (BMI) 29.0-29.9, adult: Secondary | ICD-10-CM | POA: Diagnosis not present

## 2022-04-12 DIAGNOSIS — Z1231 Encounter for screening mammogram for malignant neoplasm of breast: Secondary | ICD-10-CM | POA: Diagnosis not present

## 2022-04-12 DIAGNOSIS — E118 Type 2 diabetes mellitus with unspecified complications: Secondary | ICD-10-CM | POA: Diagnosis not present

## 2022-05-04 ENCOUNTER — Ambulatory Visit
Admission: RE | Admit: 2022-05-04 | Discharge: 2022-05-04 | Disposition: A | Discharge status: Home / Self Care | Payer: BC Managed Care – PPO | Source: Ambulatory Visit | Attending: Nurse Practitioner | Admitting: Nurse Practitioner

## 2022-05-04 VITALS — BP 129/81 | HR 88 | Temp 98.4°F | Resp 20

## 2022-05-04 DIAGNOSIS — J019 Acute sinusitis, unspecified: Secondary | ICD-10-CM

## 2022-05-04 DIAGNOSIS — B9789 Other viral agents as the cause of diseases classified elsewhere: Secondary | ICD-10-CM | POA: Diagnosis not present

## 2022-05-04 DIAGNOSIS — Z1152 Encounter for screening for COVID-19: Secondary | ICD-10-CM | POA: Diagnosis not present

## 2022-05-04 NOTE — ED Provider Notes (Signed)
[facility]    CSN: [id number] Arrival date & time: 05/04/22  1211      History   Chief Complaint Chief Complaint  Patient presents with   Nasal Congestion    Sinus Infection - Entered by patient    HPI Lori Kline is a 52 y.o. female.   Patient presents today for 2-day history of green nasal congestion, runny nose, sinus pressure in her cheeks and above her eyes, headache, right ear pressure without drainage, and fatigue.  She denies fever, cough, shortness of breath or chest pain, postnasal drainage, sore throat, abdominal pain, nausea/vomiting, diarrhea, decreased appetite.  Has been taking Mucinex which does not really help his symptoms much.  No known sick contacts.  Patient reports history of seasonal allergies and just restarted back taking Claritin.  Patient reports history of smoking, however quit smoking years ago.    Past Medical History:  Diagnosis Date   Anxiety    Diabetes mellitus without complication ([place])    Hypertension    Irregular periods/menstrual cycles 03/26/2015   [place] 03/26/2015   Peri-menopause 03/26/2015    Patient Active Problem List   Diagnosis Date Noted   PMB (postmenopausal bleeding) 10/13/2021   Encounter for screening fecal occult blood testing 07/22/2020   Postmenopausal 07/22/2020   Encounter for well woman exam with routine gynecological exam 07/11/2019   Screening for colorectal cancer 05/22/2018   Encounter for gynecological examination with Papanicolaou smear of cervix 05/22/2018   Mixed stress and urge urinary incontinence 05/18/2017   Well woman exam with routine gynecological exam 05/18/2017   [facility] 03/26/2015   Irregular periods/menstrual cycles 03/26/2015   Peri-menopause 03/26/2015    Past Surgical History:  Procedure Laterality Date   BUNIONECTOMY     CHOLECYSTECTOMY     COLONOSCOPY WITH PROPOFOL N/A 02/19/2020   Procedure: COLONOSCOPY WITH PROPOFOL;  Surgeon: Harvel Quale, MD;   Location: [facility];  Service: Gastroenterology;  Laterality: N/A;  10:00   HAMMER TOE SURGERY Right 01/21/15    OB History     Gravida  2   Para  2   Term  0   Preterm      AB      Living  2      SAB      IAB      Ectopic      Multiple      Live Births  2            Home Medications    Prior to Admission medications   Medication Sig Start Date End Date Taking? Authorizing Provider  cholecalciferol (VITAMIN D3) 25 MCG (1000 UNIT) tablet Take 1,000 Units by mouth daily.    [provider]  escitalopram (LEXAPRO) 10 MG tablet Take 10 mg by mouth daily.  02/16/13   [provider]  loratadine (CLARITIN) 10 MG tablet Take 10 mg by mouth daily. Patient not taking: Reported on 07/23/2021    [provider]  metFORMIN (GLUCOPHAGE) 1000 MG tablet Take 1,000 mg by mouth 2 (two) times daily. 12/06/19   [provider]    Family History Family History  Problem Relation Age of Onset   Hyperlipidemia Mother    Hypertension Mother    Diabetes Father    Diabetes Paternal Grandfather     Social History Social History   Tobacco Use   Smoking status: Former    Types: Cigarettes   Smokeless tobacco: Never  Vaping Use   Vaping Use:  Never used  Substance Use Topics   Alcohol use: No   Drug use: No     Allergies   Patient has no known allergies.   Review of Systems Review of Systems Per HPI  Physical Exam Triage Vital Signs ED Triage Vitals  Enc Vitals Group     BP 05/04/22 1253 129/81     Pulse Rate 05/04/22 1253 88     Resp 05/04/22 1253 20     Temp 05/04/22 1253 98.4 F (36.9 C)     Temp Source 05/04/22 1253 Oral     SpO2 05/04/22 1253 95 %     Weight --      Height --      Head Circumference --      Peak Flow --      Pain Score 05/04/22 1254 0     Pain Loc --      Pain Edu? --      Excl. in [place]? --    No data found.  Updated Vital Signs BP 129/81   Pulse 88   Temp 98.4 F (36.9 C) (Oral)   Resp  20   LMP 03/09/2017   SpO2 95%   Visual Acuity Right Eye Distance:   Left Eye Distance:   Bilateral Distance:    Right Eye Near:   Left Eye Near:    Bilateral Near:     Physical Exam Vitals and nursing note reviewed.  Constitutional:      General: She is not in acute distress.    Appearance: Normal appearance. She is not ill-appearing or toxic-appearing.  HENT:     Head: Normocephalic and atraumatic.     Right Ear: Tympanic membrane, ear canal and external ear normal.     Left Ear: Tympanic membrane, ear canal and external ear normal.     Nose: Congestion present. No rhinorrhea.     Right Sinus: No maxillary sinus tenderness or frontal sinus tenderness.     Left Sinus: No maxillary sinus tenderness or frontal sinus tenderness.     Mouth/Throat:     Mouth: Mucous membranes are moist.     Pharynx: Oropharynx is clear. Posterior oropharyngeal erythema present. No oropharyngeal exudate.  Eyes:     General: No scleral icterus.    Extraocular Movements: Extraocular movements intact.  Cardiovascular:     Rate and Rhythm: Normal rate and regular rhythm.  Pulmonary:     Effort: Pulmonary effort is normal. No respiratory distress.     Breath sounds: Normal breath sounds. No wheezing, rhonchi or rales.  Abdominal:     General: Abdomen is flat. Bowel sounds are normal. There is no distension.     Palpations: Abdomen is soft.     Tenderness: There is no abdominal tenderness. There is no guarding.  Musculoskeletal:     Cervical back: Normal range of motion and neck supple.  Lymphadenopathy:     Cervical: No cervical adenopathy.  Skin:    General: Skin is warm and dry.     Coloration: Skin is not jaundiced or pale.     Findings: No erythema or rash.  Neurological:     Mental Status: She is alert and oriented to person, place, and time.  Psychiatric:        Behavior: Behavior is cooperative.      UC Treatments / Results  Labs (all labs ordered are listed, but only abnormal  results are displayed) Labs Reviewed  SARS CORONAVIRUS 2 (TAT 6-24 HRS)  EKG   Radiology No results found.  Procedures Procedures (including critical care time)  Medications Ordered in UC Medications - No data to display  Initial Impression / Assessment and Plan / UC Course  I have reviewed the triage vital signs and the nursing notes.  Pertinent labs & imaging results that were available during my care of the patient were reviewed by me and considered in my medical decision making (see chart for details).   Patient is well-appearing, normotensive, afebrile, not tachycardic, not tachypneic, oxygenating well on room air.  1. Acute viral sinusitis 2. Encounter for screening for COVID-19 Suspect viral etiology COVID-19 testing obtained Supportive care discussed with patient ER and return precautions discussed  The patient was given the opportunity to ask questions.  All questions answered to their satisfaction.  The patient is in agreement to this plan.    Final Clinical Impressions(s) / UC Diagnoses   Final diagnoses:  Acute viral sinusitis  Encounter for screening for COVID-19     Discharge Instructions      You have a viral sinus infection.  Symptoms should improve over the next week to 10 days.  If you develop chest pain or shortness of breath, go to the emergency room.  We have tested you today for COVID-19.  You will see the results in Mychart and we will call you with positive results.  Please stay home and isolate until you are aware of the results.    Some things that can make you feel better are: - Increased rest - Increasing fluid with water/sugar free electrolytes - Acetaminophen and ibuprofen as needed for fever/pain - Salt water gargling, chloraseptic spray and throat lozenges for sore throat - OTC guaifenesin (Mucinex) 600 mg twice daily for congestion - Saline sinus flushes or a neti pot for congestion - Humidifying the air     ED  Prescriptions   None    PDMP not reviewed this encounter.   Eulogio Bear, NP 05/04/22 1324

## 2022-05-04 NOTE — Discharge Instructions (Addendum)
You have a viral sinus infection.  Symptoms should improve over the next week to 10 days.  If you develop chest pain or shortness of breath, go to the emergency room.  We have tested you today for COVID-19.  You will see the results in Mychart and we will call you with positive results.  Please stay home and isolate until you are aware of the results.    Some things that can make you feel better are: - Increased rest - Increasing fluid with water/sugar free electrolytes - Acetaminophen and ibuprofen as needed for fever/pain - Salt water gargling, chloraseptic spray and throat lozenges for sore throat - OTC guaifenesin (Mucinex) 600 mg twice daily for congestion - Saline sinus flushes or a neti pot for congestion - Humidifying the air

## 2022-05-04 NOTE — ED Triage Notes (Signed)
Pt reports she has a sinus infection. Says her ears, eyes, and head hurt as well as some green mucus x 2 days

## 2022-05-05 LAB — SARS CORONAVIRUS 2 (TAT 6-24 HRS): SARS Coronavirus 2: NEGATIVE

## 2022-07-01 DIAGNOSIS — Z1283 Encounter for screening for malignant neoplasm of skin: Secondary | ICD-10-CM | POA: Diagnosis not present

## 2022-07-01 DIAGNOSIS — D229 Melanocytic nevi, unspecified: Secondary | ICD-10-CM | POA: Diagnosis not present

## 2022-07-01 DIAGNOSIS — L821 Other seborrheic keratosis: Secondary | ICD-10-CM | POA: Diagnosis not present

## 2022-07-01 DIAGNOSIS — L568 Other specified acute skin changes due to ultraviolet radiation: Secondary | ICD-10-CM | POA: Diagnosis not present

## 2022-08-04 ENCOUNTER — Ambulatory Visit (INDEPENDENT_AMBULATORY_CARE_PROVIDER_SITE_OTHER): Payer: BC Managed Care – PPO | Admitting: Adult Health

## 2022-08-04 ENCOUNTER — Encounter: Payer: Self-pay | Admitting: Adult Health

## 2022-08-04 VITALS — BP 106/64 | HR 81 | Ht 60.0 in | Wt 156.0 lb

## 2022-08-04 DIAGNOSIS — N3946 Mixed incontinence: Secondary | ICD-10-CM | POA: Diagnosis not present

## 2022-08-04 DIAGNOSIS — Z1211 Encounter for screening for malignant neoplasm of colon: Secondary | ICD-10-CM | POA: Diagnosis not present

## 2022-08-04 DIAGNOSIS — Z780 Asymptomatic menopausal state: Secondary | ICD-10-CM | POA: Diagnosis not present

## 2022-08-04 DIAGNOSIS — Z01419 Encounter for gynecological examination (general) (routine) without abnormal findings: Secondary | ICD-10-CM | POA: Diagnosis not present

## 2022-08-04 DIAGNOSIS — N3281 Overactive bladder: Secondary | ICD-10-CM | POA: Insufficient documentation

## 2022-08-04 LAB — HEMOCCULT GUIAC POC 1CARD (OFFICE): Fecal Occult Blood, POC: NEGATIVE

## 2022-08-04 MED ORDER — MIRABEGRON ER 25 MG PO TB24: 25.0000 mg | ORAL_TABLET | Freq: Every day | ORAL | 0 refills | Status: DC

## 2022-08-04 NOTE — Progress Notes (Signed)
Patient ID: Lori Kline, female   DOB: 12/03/1970, 52 y.o.   MRN: [id number] History of Present Illness: Lori Kline is a 52 year old white female, married, PM in for a well woman gyn exam. 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   Last pap was negative HPV,NILM 07/23/21.  PCP is Dr Phillips Odor   Current Medications, Allergies, Past Medical History, Past Surgical History, Family History and Social History were reviewed in [employer] electronic medical record.     Review of Systems: Patient denies any headaches, hearing loss, fatigue, blurred vision, shortness of breath, chest pain, abdominal pain, problems with bowel movements,or intercourse. No joint pain or mood swings.  She denies any vaginal bleeding She is having to wear pad, has urinary incontinence at times and has to pee often.   Physical Exam:BP 106/64 (BP Location: Left Arm, Patient Position: Sitting, Cuff Size: Normal)   Pulse 81   Ht 5' (1.524 m)   Wt 156 lb (70.8 kg)   LMP 03/09/2017   BMI 30.47 kg/m   General:  Well developed, well nourished, no acute distress Skin:  Warm and dry Neck:  Midline trachea, normal thyroid, good ROM, no lymphadenopathy Lungs; Clear to auscultation bilaterally Breast:  No dominant palpable mass, retraction, or nipple discharge Cardiovascular: Regular rate and rhythm Abdomen:  Soft, non tender, no hepatosplenomegaly Pelvic:  External genitalia is normal in appearance, no lesions.  The vagina is pale. Urethra has no lesions or masses. The cervix is smooth.  Uterus is felt to be normal size, shape, and contour.  No adnexal masses or tenderness noted.Bladder is non tender, no masses felt. Rectal: Good sphincter tone, no polyps, or hemorrhoids felt.  Hemoccult negative. Extremities/musculoskeletal:  No swelling or varicosities noted, no clubbing or cyanosis Psych:  No mood changes, alert and cooperative,seems happy AA is 1 Fall risk is low    08/04/2022   11:50 AM  07/23/2021    8:35 AM 07/22/2020   11:27 AM  Depression screen PHQ 2/9  Decreased Interest 0 0 0  Down, Depressed, Hopeless 0 0 0  PHQ - 2 Score 0 0 0  Altered sleeping 0 0 0  Tired, decreased energy 0 0 0  Change in appetite 0 0 0  Feeling bad or failure about yourself  0 0 0  Trouble concentrating 0 0 0  Moving slowly or fidgety/restless 0 0 0  Suicidal thoughts 0 0 0  PHQ-9 Score 0 0 0       08/04/2022   11:50 AM 07/23/2021    8:35 AM 07/22/2020   11:27 AM 07/11/2019    3:40 PM  GAD 7 : Generalized Anxiety Score  Nervous, Anxious, on Edge 0 0 0 0  Control/stop worrying 0 0 0 0  Worry too much - different things 0 0 0 0  Trouble relaxing 0 0 0 0  Restless 0 0 0 0  Easily annoyed or irritable 0 0 0 0  Afraid - awful might happen 0 0 0 0  Total GAD 7 Score 0 0 0 0      Upstream - 08/04/22 1157       Pregnancy Intention Screening   Does the patient want to become pregnant in the next year? N/A    Does the patient's partner want to become pregnant in the next year? N/A    Would the patient like to discuss contraceptive options today? N/A      Contraception Wrap Up  Current Method Vasectomy    End Method Vasectomy    Contraception Counseling Provided No            Examination chaperoned by Malachy Mood LPN   Impression and Plan: 1. Encounter for well woman exam with routine gynecological exam Pap in 2026 Physical in 1 year Mammogram was negative 11/02/21 Colonoscopy per GI Labs with PCP  2. Encounter for screening fecal occult blood testing Hemoccult was negative  - POCT occult blood stool  3. Postmenopausal Denies any vaginal bleeding   4. Mixed stress and urge urinary incontinence +UI Wears a pad   5. OAB (overactive bladder) Has to pee often  Will try myrbetriq 25 mg 1 daily, #28 tabs given and let me know if helps will rx Meds ordered this encounter  Medications   mirabegron ER (MYRBETRIQ) 25 MG TB24 tablet    Sig: Take 1 tablet (25 mg total)  by mouth daily.    Dispense:  28 tablet    Refill:  0    Order Specific Question:   Supervising Provider    Answer:   Duane Lope H [[id number]]

## 2022-08-26 ENCOUNTER — Other Ambulatory Visit: Payer: Self-pay | Admitting: Adult Health

## 2022-08-26 MED ORDER — MIRABEGRON ER 25 MG PO TB24: 25.0000 mg | ORAL_TABLET | Freq: Every day | ORAL | 12 refills | Status: DC

## 2022-08-26 NOTE — Progress Notes (Signed)
Rx sent for myrbetriq.

## 2022-09-23 ENCOUNTER — Other Ambulatory Visit (HOSPITAL_COMMUNITY): Payer: Self-pay | Admitting: Family Medicine

## 2022-09-23 DIAGNOSIS — Z1231 Encounter for screening mammogram for malignant neoplasm of breast: Secondary | ICD-10-CM

## 2022-10-18 ENCOUNTER — Ambulatory Visit (HOSPITAL_COMMUNITY)
Admission: RE | Admit: 2022-10-18 | Discharge: 2022-10-18 | Disposition: A | Discharge status: Home / Self Care | Payer: BC Managed Care – PPO | Source: Ambulatory Visit | Attending: Family Medicine | Admitting: Family Medicine

## 2022-10-18 DIAGNOSIS — Z1231 Encounter for screening mammogram for malignant neoplasm of breast: Secondary | ICD-10-CM | POA: Insufficient documentation

## 2022-11-08 DIAGNOSIS — E782 Mixed hyperlipidemia: Secondary | ICD-10-CM | POA: Diagnosis not present

## 2022-11-08 DIAGNOSIS — E119 Type 2 diabetes mellitus without complications: Secondary | ICD-10-CM | POA: Diagnosis not present

## 2022-11-08 DIAGNOSIS — Z6829 Body mass index (BMI) 29.0-29.9, adult: Secondary | ICD-10-CM | POA: Diagnosis not present

## 2022-11-08 DIAGNOSIS — E6609 Other obesity due to excess calories: Secondary | ICD-10-CM | POA: Diagnosis not present

## 2022-11-11 DIAGNOSIS — E7849 Other hyperlipidemia: Secondary | ICD-10-CM | POA: Diagnosis not present

## 2022-11-11 DIAGNOSIS — E119 Type 2 diabetes mellitus without complications: Secondary | ICD-10-CM | POA: Diagnosis not present

## 2023-05-20 DIAGNOSIS — E663 Overweight: Secondary | ICD-10-CM | POA: Diagnosis not present

## 2023-05-20 DIAGNOSIS — E119 Type 2 diabetes mellitus without complications: Secondary | ICD-10-CM | POA: Diagnosis not present

## 2023-05-20 DIAGNOSIS — Z6829 Body mass index (BMI) 29.0-29.9, adult: Secondary | ICD-10-CM | POA: Diagnosis not present

## 2023-05-20 DIAGNOSIS — J329 Chronic sinusitis, unspecified: Secondary | ICD-10-CM | POA: Diagnosis not present

## 2023-07-04 DIAGNOSIS — L821 Other seborrheic keratosis: Secondary | ICD-10-CM | POA: Diagnosis not present

## 2023-07-04 DIAGNOSIS — Z1283 Encounter for screening for malignant neoplasm of skin: Secondary | ICD-10-CM | POA: Diagnosis not present

## 2023-07-04 DIAGNOSIS — D229 Melanocytic nevi, unspecified: Secondary | ICD-10-CM | POA: Diagnosis not present

## 2023-08-09 ENCOUNTER — Ambulatory Visit: Admitting: Adult Health

## 2023-08-09 ENCOUNTER — Encounter: Payer: Self-pay | Admitting: Adult Health

## 2023-08-09 VITALS — BP 116/78 | HR 87 | Ht 60.0 in | Wt 160.5 lb

## 2023-08-09 DIAGNOSIS — N3281 Overactive bladder: Secondary | ICD-10-CM | POA: Diagnosis not present

## 2023-08-09 DIAGNOSIS — Z01419 Encounter for gynecological examination (general) (routine) without abnormal findings: Secondary | ICD-10-CM | POA: Diagnosis not present

## 2023-08-09 DIAGNOSIS — Z1211 Encounter for screening for malignant neoplasm of colon: Secondary | ICD-10-CM | POA: Diagnosis not present

## 2023-08-09 DIAGNOSIS — N3946 Mixed incontinence: Secondary | ICD-10-CM

## 2023-08-09 DIAGNOSIS — Z1331 Encounter for screening for depression: Secondary | ICD-10-CM | POA: Diagnosis not present

## 2023-08-09 LAB — HEMOCCULT GUIAC POC 1CARD (OFFICE): Fecal Occult Blood, POC: NEGATIVE

## 2023-08-09 NOTE — Progress Notes (Signed)
 Patient ID: Lori Kline, female   DOB: [birth year], 53 y.o.   MRN: [id number] History of Present Illness: Lori Kline is a 53 year old white female,married, PM in for a well woman gyn exam.     Component Value Date/Time   DIAGPAP  07/23/2021 0832    - Negative for intraepithelial lesion or malignancy (NILM)   DIAGPAP  05/22/2018 0000    NEGATIVE FOR INTRAEPITHELIAL LESIONS OR MALIGNANCY.   HPVHIGH Negative 07/23/2021 0832   ADEQPAP  07/23/2021 0832    Satisfactory for evaluation; transformation zone component PRESENT.   ADEQPAP  05/22/2018 0000    Satisfactory for evaluation  endocervical/transformation zone component ABSENT.   PCP is Dr Glady Laming   Current Medications, Allergies, Past Medical History, Past Surgical History, Family History and Social History were reviewed in [employer] electronic medical record.     Review of Systems: Patient denies any headaches, hearing loss, fatigue, blurred vision, shortness of breath, chest pain, abdominal pain, problems with bowel movements, urination, or intercourse. No joint pain or mood swings.  OAB and UI better with myrbetriq     Physical Exam:BP 116/78 (BP Location: Left Arm, Patient Position: Sitting, Cuff Size: Normal)   Pulse 87   Ht 5' (1.524 m)   Wt 160 lb 8 oz (72.8 kg)   LMP 03/09/2017   BMI 31.35 kg/m   General:  Well developed, well nourished, no acute distress Skin:  Warm and dry Neck:  Midline trachea, normal thyroid , good ROM, no lymphadenopathy Lungs; Clear to auscultation bilaterally Breast:  No dominant palpable mass, retraction, or nipple discharge Cardiovascular: Regular rate and rhythm Abdomen:  Soft, non tender, no hepatosplenomegaly Pelvic:  External genitalia is normal in appearance, no lesions.  The vagina is normal in appearance. Urethra has no lesions or masses. The cervix is smooth.  Uterus is felt to be normal size, shape, and contour.  No adnexal masses or tenderness noted.Bladder is non tender, no  masses felt. Rectal: Good sphincter tone, no polyps, or hemorrhoids felt.  Hemoccult negative. Extremities/musculoskeletal:  No swelling or varicosities noted, no clubbing or cyanosis Psych:  No mood changes, alert and cooperative,seems happy AA is 0 Fall risk is low    08/09/2023    2:38 PM 08/04/2022   11:50 AM 07/23/2021    8:35 AM  Depression screen PHQ 2/9  Decreased Interest 0 0 0  Down, Depressed, Hopeless 0 0 0  PHQ - 2 Score 0 0 0  Altered sleeping 0 0 0  Tired, decreased energy 0 0 0  Change in appetite 0 0 0  Feeling bad or failure about yourself  0 0 0  Trouble concentrating 0 0 0  Moving slowly or fidgety/restless 0 0 0  Suicidal thoughts 0 0 0  PHQ-9 Score 0 0 0       08/09/2023    2:38 PM 08/04/2022   11:50 AM 07/23/2021    8:35 AM 07/22/2020   11:27 AM  GAD 7 : Generalized Anxiety Score  Nervous, Anxious, on Edge 0 0 0 0  Control/stop worrying 0 0 0 0  Worry too much - different things 0 0 0 0  Trouble relaxing 0 0 0 0  Restless 0 0 0 0  Easily annoyed or irritable 0 0 0 0  Afraid - awful might happen 0 0 0 0  Total GAD 7 Score 0 0 0 0    Upstream - 08/09/23 1437       Pregnancy Intention Screening   Does  the patient want to become pregnant in the next year? No    Does the patient's partner want to become pregnant in the next year? No    Would the patient like to discuss contraceptive options today? No      Contraception Wrap Up   Current Method Vasectomy   PM   End Method Vasectomy   PM   Contraception Counseling Provided No              Examination chaperoned by Alphonso Aschoff LPN   Impression and Plan: 1. Encounter for well woman exam with routine gynecological exam (Primary) Pap and physical in 1 year Labs with PCP Colonoscopy per GI Mammogram was negative 10/18/22   2. Encounter for screening fecal occult blood testing Hemoccult was negative  - POCT occult blood stool  3. OAB (overactive bladder)  Better with myrbetriq  25 mg 1 daily    4. Mixed stress and urge urinary incontinence Better on Myrbetriq , has refills

## 2023-09-14 ENCOUNTER — Other Ambulatory Visit: Payer: Self-pay | Admitting: Adult Health

## 2023-09-29 ENCOUNTER — Other Ambulatory Visit (HOSPITAL_COMMUNITY): Payer: Self-pay | Admitting: Family Medicine

## 2023-09-29 DIAGNOSIS — Z1231 Encounter for screening mammogram for malignant neoplasm of breast: Secondary | ICD-10-CM

## 2023-10-21 ENCOUNTER — Ambulatory Visit (HOSPITAL_COMMUNITY)
Admission: RE | Admit: 2023-10-21 | Discharge: 2023-10-21 | Disposition: A | Discharge status: Home / Self Care | Source: Ambulatory Visit | Attending: Family Medicine | Admitting: Family Medicine

## 2023-10-21 ENCOUNTER — Encounter (HOSPITAL_COMMUNITY): Payer: Self-pay

## 2023-10-21 DIAGNOSIS — Z1231 Encounter for screening mammogram for malignant neoplasm of breast: Secondary | ICD-10-CM | POA: Diagnosis not present

## 2023-11-04 DIAGNOSIS — E119 Type 2 diabetes mellitus without complications: Secondary | ICD-10-CM | POA: Diagnosis not present

## 2023-11-04 DIAGNOSIS — E782 Mixed hyperlipidemia: Secondary | ICD-10-CM | POA: Diagnosis not present

## 2023-11-04 DIAGNOSIS — E6609 Other obesity due to excess calories: Secondary | ICD-10-CM | POA: Diagnosis not present

## 2023-11-04 DIAGNOSIS — E7849 Other hyperlipidemia: Secondary | ICD-10-CM | POA: Diagnosis not present

## 2023-11-04 DIAGNOSIS — Z6829 Body mass index (BMI) 29.0-29.9, adult: Secondary | ICD-10-CM | POA: Diagnosis not present

## 2024-01-24 DIAGNOSIS — E785 Hyperlipidemia, unspecified: Secondary | ICD-10-CM | POA: Diagnosis not present
